# Patient Record
Sex: Male | Born: 1940 | Race: White | Hispanic: No | Smoking: Current every day smoker
Health system: Southern US, Community
[De-identification: ages and names within clinical notes are randomized; demographics above are authoritative.]

## PROBLEM LIST (undated history)

## (undated) DIAGNOSIS — E785 Hyperlipidemia, unspecified: Secondary | ICD-10-CM

## (undated) DIAGNOSIS — R05 Cough: Secondary | ICD-10-CM

## (undated) DIAGNOSIS — I219 Acute myocardial infarction, unspecified: Secondary | ICD-10-CM

## (undated) DIAGNOSIS — Z8614 Personal history of Methicillin resistant Staphylococcus aureus infection: Secondary | ICD-10-CM

## (undated) DIAGNOSIS — C801 Malignant (primary) neoplasm, unspecified: Secondary | ICD-10-CM

## (undated) DIAGNOSIS — F319 Bipolar disorder, unspecified: Secondary | ICD-10-CM

## (undated) DIAGNOSIS — I509 Heart failure, unspecified: Secondary | ICD-10-CM

## (undated) DIAGNOSIS — R351 Nocturia: Secondary | ICD-10-CM

## (undated) DIAGNOSIS — J449 Chronic obstructive pulmonary disease, unspecified: Secondary | ICD-10-CM

## (undated) DIAGNOSIS — I10 Essential (primary) hypertension: Secondary | ICD-10-CM

## (undated) DIAGNOSIS — R609 Edema, unspecified: Secondary | ICD-10-CM

## (undated) DIAGNOSIS — R42 Dizziness and giddiness: Secondary | ICD-10-CM

## (undated) DIAGNOSIS — N400 Enlarged prostate without lower urinary tract symptoms: Secondary | ICD-10-CM

## (undated) DIAGNOSIS — T8182XA Emphysema (subcutaneous) resulting from a procedure, initial encounter: Secondary | ICD-10-CM

## (undated) DIAGNOSIS — E119 Type 2 diabetes mellitus without complications: Secondary | ICD-10-CM

## (undated) DIAGNOSIS — Z8619 Personal history of other infectious and parasitic diseases: Secondary | ICD-10-CM

## (undated) DIAGNOSIS — Z86010 Personal history of colonic polyps: Secondary | ICD-10-CM

## (undated) DIAGNOSIS — J42 Unspecified chronic bronchitis: Secondary | ICD-10-CM

## (undated) DIAGNOSIS — M549 Dorsalgia, unspecified: Secondary | ICD-10-CM

## (undated) DIAGNOSIS — M1990 Unspecified osteoarthritis, unspecified site: Secondary | ICD-10-CM

## (undated) DIAGNOSIS — I2510 Atherosclerotic heart disease of native coronary artery without angina pectoris: Secondary | ICD-10-CM

## (undated) DIAGNOSIS — J189 Pneumonia, unspecified organism: Secondary | ICD-10-CM

## (undated) DIAGNOSIS — R059 Cough, unspecified: Secondary | ICD-10-CM

## (undated) DIAGNOSIS — K219 Gastro-esophageal reflux disease without esophagitis: Secondary | ICD-10-CM

## (undated) DIAGNOSIS — IMO0001 Reserved for inherently not codable concepts without codable children: Secondary | ICD-10-CM

## (undated) HISTORY — PX: KNEE SURGERY: SHX244

## (undated) HISTORY — PX: COLONOSCOPY: SHX174

## (undated) HISTORY — PX: MANDIBLE FRACTURE SURGERY: SHX706

## (undated) HISTORY — PX: TONSILLECTOMY: SUR1361

## (undated) HISTORY — PX: INGUINAL HERNIA REPAIR: SUR1180

---

## 2011-05-24 DIAGNOSIS — Z8614 Personal history of Methicillin resistant Staphylococcus aureus infection: Secondary | ICD-10-CM

## 2011-05-24 HISTORY — DX: Personal history of Methicillin resistant Staphylococcus aureus infection: Z86.14

## 2012-05-23 DIAGNOSIS — I219 Acute myocardial infarction, unspecified: Secondary | ICD-10-CM

## 2012-05-23 HISTORY — PX: CORONARY ARTERY BYPASS GRAFT: SHX141

## 2012-05-23 HISTORY — DX: Acute myocardial infarction, unspecified: I21.9

## 2014-11-26 ENCOUNTER — Emergency Department (HOSPITAL_COMMUNITY): Payer: Medicare Other

## 2014-11-26 ENCOUNTER — Inpatient Hospital Stay (HOSPITAL_COMMUNITY)
Admission: EM | Admit: 2014-11-26 | Discharge: 2014-11-28 | DRG: 190 | Disposition: A | Discharge status: Home / Self Care | Payer: Medicare Other | Attending: Family Medicine | Admitting: Family Medicine

## 2014-11-26 ENCOUNTER — Encounter (HOSPITAL_COMMUNITY): Payer: Self-pay | Admitting: Emergency Medicine

## 2014-11-26 DIAGNOSIS — Z8701 Personal history of pneumonia (recurrent): Secondary | ICD-10-CM

## 2014-11-26 DIAGNOSIS — D751 Secondary polycythemia: Secondary | ICD-10-CM | POA: Diagnosis present

## 2014-11-26 DIAGNOSIS — Z7982 Long term (current) use of aspirin: Secondary | ICD-10-CM

## 2014-11-26 DIAGNOSIS — R0600 Dyspnea, unspecified: Secondary | ICD-10-CM | POA: Diagnosis present

## 2014-11-26 DIAGNOSIS — R944 Abnormal results of kidney function studies: Secondary | ICD-10-CM | POA: Diagnosis present

## 2014-11-26 DIAGNOSIS — I2510 Atherosclerotic heart disease of native coronary artery without angina pectoris: Secondary | ICD-10-CM | POA: Diagnosis present

## 2014-11-26 DIAGNOSIS — E871 Hypo-osmolality and hyponatremia: Secondary | ICD-10-CM | POA: Diagnosis not present

## 2014-11-26 DIAGNOSIS — J441 Chronic obstructive pulmonary disease with (acute) exacerbation: Principal | ICD-10-CM | POA: Diagnosis present

## 2014-11-26 DIAGNOSIS — Z951 Presence of aortocoronary bypass graft: Secondary | ICD-10-CM

## 2014-11-26 DIAGNOSIS — I4510 Unspecified right bundle-branch block: Secondary | ICD-10-CM | POA: Diagnosis present

## 2014-11-26 DIAGNOSIS — I509 Heart failure, unspecified: Secondary | ICD-10-CM | POA: Diagnosis present

## 2014-11-26 DIAGNOSIS — J810 Acute pulmonary edema: Secondary | ICD-10-CM | POA: Insufficient documentation

## 2014-11-26 DIAGNOSIS — E1165 Type 2 diabetes mellitus with hyperglycemia: Secondary | ICD-10-CM | POA: Diagnosis present

## 2014-11-26 DIAGNOSIS — I252 Old myocardial infarction: Secondary | ICD-10-CM | POA: Diagnosis not present

## 2014-11-26 DIAGNOSIS — Z9861 Coronary angioplasty status: Secondary | ICD-10-CM

## 2014-11-26 DIAGNOSIS — Z79899 Other long term (current) drug therapy: Secondary | ICD-10-CM

## 2014-11-26 DIAGNOSIS — J9601 Acute respiratory failure with hypoxia: Secondary | ICD-10-CM | POA: Diagnosis present

## 2014-11-26 DIAGNOSIS — F17210 Nicotine dependence, cigarettes, uncomplicated: Secondary | ICD-10-CM | POA: Diagnosis present

## 2014-11-26 DIAGNOSIS — F319 Bipolar disorder, unspecified: Secondary | ICD-10-CM | POA: Diagnosis present

## 2014-11-26 DIAGNOSIS — Z794 Long term (current) use of insulin: Secondary | ICD-10-CM | POA: Diagnosis not present

## 2014-11-26 DIAGNOSIS — I10 Essential (primary) hypertension: Secondary | ICD-10-CM | POA: Diagnosis present

## 2014-11-26 DIAGNOSIS — Z6831 Body mass index (BMI) 31.0-31.9, adult: Secondary | ICD-10-CM

## 2014-11-26 DIAGNOSIS — E669 Obesity, unspecified: Secondary | ICD-10-CM | POA: Diagnosis present

## 2014-11-26 HISTORY — DX: Acute myocardial infarction, unspecified: I21.9

## 2014-11-26 HISTORY — DX: Chronic obstructive pulmonary disease, unspecified: J44.9

## 2014-11-26 HISTORY — DX: Heart failure, unspecified: I50.9

## 2014-11-26 HISTORY — DX: Atherosclerotic heart disease of native coronary artery without angina pectoris: I25.10

## 2014-11-26 HISTORY — DX: Type 2 diabetes mellitus without complications: E11.9

## 2014-11-26 HISTORY — DX: Pneumonia, unspecified organism: J18.9

## 2014-11-26 HISTORY — DX: Bipolar disorder, unspecified: F31.9

## 2014-11-26 HISTORY — DX: Unspecified chronic bronchitis: J42

## 2014-11-26 HISTORY — DX: Essential (primary) hypertension: I10

## 2014-11-26 LAB — CBC
HCT: 56.9 % — ABNORMAL HIGH (ref 39.0–52.0)
Hemoglobin: 19.2 g/dL — ABNORMAL HIGH (ref 13.0–17.0)
MCH: 32.4 pg (ref 26.0–34.0)
MCHC: 33.7 g/dL (ref 30.0–36.0)
MCV: 96 fL (ref 78.0–100.0)
PLATELETS: 155 10*3/uL (ref 150–400)
RBC: 5.93 MIL/uL — AB (ref 4.22–5.81)
RDW: 14.8 % (ref 11.5–15.5)
WBC: 10.4 10*3/uL (ref 4.0–10.5)

## 2014-11-26 LAB — BASIC METABOLIC PANEL
ANION GAP: 7 (ref 5–15)
BUN: 18 mg/dL (ref 6–20)
CHLORIDE: 110 mmol/L (ref 101–111)
CO2: 25 mmol/L (ref 22–32)
Calcium: 8.4 mg/dL — ABNORMAL LOW (ref 8.9–10.3)
Creatinine, Ser: 1.46 mg/dL — ABNORMAL HIGH (ref 0.61–1.24)
GFR calc Af Amer: 53 mL/min — ABNORMAL LOW (ref >60)
GFR calc non Af Amer: 46 mL/min — ABNORMAL LOW (ref >60)
Glucose, Bld: 79 mg/dL (ref 65–99)
Potassium: 4.3 mmol/L (ref 3.5–5.1)
Sodium: 142 mmol/L (ref 135–145)

## 2014-11-26 LAB — I-STAT TROPONIN, ED: Troponin i, poc: 0.06 ng/mL (ref 0.00–0.08)

## 2014-11-26 LAB — GLUCOSE, CAPILLARY: GLUCOSE-CAPILLARY: 473 mg/dL — AB (ref 65–99)

## 2014-11-26 LAB — BRAIN NATRIURETIC PEPTIDE: B NATRIURETIC PEPTIDE 5: 723.8 pg/mL — AB (ref 0.0–100.0)

## 2014-11-26 LAB — TROPONIN I
TROPONIN I: 0.04 ng/mL — AB (ref <0.031)
Troponin I: 0.05 ng/mL — ABNORMAL HIGH (ref <0.031)

## 2014-11-26 MED ORDER — INSULIN ASPART 100 UNIT/ML ~~LOC~~ SOLN: 0.0000 [IU] | Freq: Three times a day (TID) | SUBCUTANEOUS | Status: DC

## 2014-11-26 MED ORDER — ALBUTEROL SULFATE (2.5 MG/3ML) 0.083% IN NEBU
5.0000 mg | INHALATION_SOLUTION | Freq: Once | RESPIRATORY_TRACT | Status: AC
  Administered 2014-11-26: 5 mg via RESPIRATORY_TRACT
  Filled 2014-11-26: qty 6

## 2014-11-26 MED ORDER — ASPIRIN 81 MG PO CHEW
81.0000 mg | CHEWABLE_TABLET | Freq: Every day | ORAL | Status: DC
  Administered 2014-11-26 – 2014-11-28 (×3): 81 mg via ORAL
  Filled 2014-11-26 (×4): qty 1

## 2014-11-26 MED ORDER — IPRATROPIUM-ALBUTEROL 0.5-2.5 (3) MG/3ML IN SOLN
3.0000 mL | RESPIRATORY_TRACT | Status: DC
  Administered 2014-11-26 – 2014-11-28 (×10): 3 mL via RESPIRATORY_TRACT
  Filled 2014-11-26 (×10): qty 3

## 2014-11-26 MED ORDER — SODIUM CHLORIDE 0.9 % IJ SOLN
3.0000 mL | Freq: Two times a day (BID) | INTRAMUSCULAR | Status: DC
  Administered 2014-11-26 – 2014-11-28 (×4): 3 mL via INTRAVENOUS

## 2014-11-26 MED ORDER — AZITHROMYCIN 250 MG PO TABS
250.0000 mg | ORAL_TABLET | Freq: Every day | ORAL | Status: DC
  Administered 2014-11-27 – 2014-11-28 (×2): 250 mg via ORAL
  Filled 2014-11-26 (×2): qty 1

## 2014-11-26 MED ORDER — IPRATROPIUM-ALBUTEROL 0.5-2.5 (3) MG/3ML IN SOLN: 3.0000 mL | RESPIRATORY_TRACT | Status: DC | PRN

## 2014-11-26 MED ORDER — INSULIN GLARGINE 100 UNIT/ML ~~LOC~~ SOLN
10.0000 [IU] | Freq: Every day | SUBCUTANEOUS | Status: DC
  Administered 2014-11-26 – 2014-11-27 (×2): 10 [IU] via SUBCUTANEOUS
  Filled 2014-11-26 (×2): qty 0.1

## 2014-11-26 MED ORDER — PREDNISONE 50 MG PO TABS
50.0000 mg | ORAL_TABLET | Freq: Every day | ORAL | Status: DC
  Administered 2014-11-27: 50 mg via ORAL
  Filled 2014-11-26 (×3): qty 1

## 2014-11-26 MED ORDER — ALBUTEROL (5 MG/ML) CONTINUOUS INHALATION SOLN
5.0000 mg/h | INHALATION_SOLUTION | Freq: Once | RESPIRATORY_TRACT | Status: DC
  Filled 2014-11-26: qty 20

## 2014-11-26 MED ORDER — HEPARIN SODIUM (PORCINE) 5000 UNIT/ML IJ SOLN
5000.0000 [IU] | Freq: Three times a day (TID) | INTRAMUSCULAR | Status: DC
  Administered 2014-11-26 – 2014-11-28 (×6): 5000 [IU] via SUBCUTANEOUS
  Filled 2014-11-26 (×7): qty 1

## 2014-11-26 MED ORDER — INSULIN ASPART 100 UNIT/ML ~~LOC~~ SOLN
0.0000 [IU] | Freq: Three times a day (TID) | SUBCUTANEOUS | Status: DC
  Administered 2014-11-26: 15 [IU] via SUBCUTANEOUS
  Administered 2014-11-27: 5 [IU] via SUBCUTANEOUS
  Administered 2014-11-27: 3 [IU] via SUBCUTANEOUS
  Administered 2014-11-27: 8 [IU] via SUBCUTANEOUS
  Administered 2014-11-28: 15 [IU] via SUBCUTANEOUS
  Administered 2014-11-28: 11 [IU] via SUBCUTANEOUS
  Administered 2014-11-28: 3 [IU] via SUBCUTANEOUS

## 2014-11-26 MED ORDER — FUROSEMIDE 10 MG/ML IJ SOLN
40.0000 mg | Freq: Once | INTRAMUSCULAR | Status: AC
  Administered 2014-11-26: 40 mg via INTRAVENOUS
  Filled 2014-11-26: qty 4

## 2014-11-26 MED ORDER — AZITHROMYCIN 500 MG PO TABS
500.0000 mg | ORAL_TABLET | Freq: Once | ORAL | Status: AC
  Administered 2014-11-26: 500 mg via ORAL
  Filled 2014-11-26: qty 1

## 2014-11-26 MED ORDER — IPRATROPIUM BROMIDE 0.02 % IN SOLN
0.5000 mg | Freq: Once | RESPIRATORY_TRACT | Status: AC
  Administered 2014-11-26: 0.5 mg via RESPIRATORY_TRACT
  Filled 2014-11-26: qty 2.5

## 2014-11-26 MED ORDER — MAGNESIUM SULFATE 2 GM/50ML IV SOLN
2.0000 g | Freq: Once | INTRAVENOUS | Status: AC
  Administered 2014-11-26: 2 g via INTRAVENOUS
  Filled 2014-11-26: qty 50

## 2014-11-26 NOTE — H&P (Signed)
[facility] Admission History and Physical Service Pager: [phone]  Patient name: Austin Stephenson Medical record number: [id number] Date of birth: [birth year] Age: 74 y.o. Gender: male  Primary Care Provider: Pcp Not In System Consultants: none Code Status: full (obtained on admission)  Chief Complaint: SOB  Assessment and Plan: Tyreck Bell is a 74 y.o. male presenting with dyspnea. PMH is significant for COPD, CAD s/p CABG at [place] in 2014, DM-2, hypertension, BPD and smoking. Also with elevated creatinine, polycythemia, and elevated troponin.   Acute respiratory failure / COPD: Dyspnea likely a combination of COPD exacerbation and CHF.Patient on albuterol inhaler and neb at home. Unclear if he takes other medication for this. He gets all his meds from [place]. Rhonchi bilaterally on exam with some wheezing in background. Less likely to be infection given stable vital signs and normal CBC. Received Solumedrol 125 mg IV on his way to ED and MgSO4 2gm in ED.  -Admit to telemetry under attending Dr Mingo Amber -Prednisone 50 mg PO daily for 5 days -Duonebs q4h / q2hr prn -Azithromycin 500 mg PO today, then 250 mg daily -Monitor O2 sats -supplemental oxygen as needed to keep sats > 88% -Will call his pharmacy ([facility] in [place] to get his med list) -Will need to be on controller medication if not already   ?CHF: unclear if this is new or old. He has mild JVD with hepatojegular reflex on exam. His CXR significant for interstitial edema. BMP 723 (baseline unknown), Trop 0.05 likely from strain vs. ACS given no chest pain. EKG sinus rythem with RBBB and T-wave inversion in lateral leads.  -Trend troponin   -s/p Lasix 40mg  IV in ED -TTE -Strict I/Os -Daily Weights -Will attempt to get records of cardiac history from [place]  CAD: s/p CABG (triple bypass) in 2014. Patient unsure of medications.  -ASA 81 mg daily -Consider BB and ACI bases on his BP. -Obtain medication hx  from his pharmacy   HTN: BP WNL. Patient couldn't tell what he takes at home. -monitor BP -Obtain medication hx from his pharmacy  DM-2: Blood glucose 79 on arrival. He reports running 70's to 80's at home. On long acting insulin 20 units plus 5 units short acting at home. - Decrease lantus to 10U daily - SSI - Consider obtaining A1c  Elevated creatinine: sCr of 1.46. Unknown baseline.  -Repeat BMP  Bipolar Disorder: on medication per pt but couldn't tell what he takes at home. -Obtain medication hx from his pharmacy  FEN/GI: F: KVO E: replete as needed N: Heart-healthy carb-modified  Prophylaxis:  -subq heparin  Disposition: [facility] pending improvement in respiratory status.   History of Present Illness: Austin Stephenson is a 74 y.o. male presents to the ED  Via EMS with shortness of breath for the last 24 hours. PMH significant for CAD s/p CABG in 2014 at [place], COPD, DM-2 on insulin and BPD. History is limited as patient is not a good historian.  Patient has had difficulty breathing for the past several days, and has significantly worsened over the past 24 hours. He tried albuterol inhaler as well as nebulizer treatments but they didn't help. Dyspnea is worse when laying flat. He normally uses two pillows at night, which hasn't changed. He reports waking up at night feeling choked. He also endorses cough, spitting up clear phlegm but no hemoptysis.  He denies chest pain, fever, chills, vision changes, abdominal pain, dysuria & edema. He endorses mild headache and nausea but denies vomiting.  Off note, patient reports multiple admission, almost every two months, to [facility] hospital in [place] for shortness of breath. He is unsure if he had Echo recently. Reports having a stress test 3-4 that was "a little less than normal."   He is on Insulin 20U long acting and 5 units short acting. He is unsure of his medication for his other medical conditions.  Is able to walk around slowly  at baseline. Walks 50-100 ft before getting short of breath. Currently weigh 204 pounds.   Patient received Solumedrol 125 mg IV enroute to ED.  Review Of Systems: Per HPI, otherwise a 12-point review of systems was performed and was negative.   Patient Active Problem List   Diagnosis Date Noted  . Dyspnea 11/26/2014   Past Medical History: Past Medical History  Diagnosis Date  . COPD (chronic obstructive pulmonary disease)   . Hypertension   . Coronary artery disease   . Type II diabetes mellitus   . Myocardial infarction 2014  . CHF (congestive heart failure)   . Pneumonia "several times"  . Chronic bronchitis     "get it just about q yr" (11/26/2014)  . Bipolar disorder    Past Surgical History: Past Surgical History  Procedure Laterality Date  . Coronary angioplasty    . Tonsillectomy    . Inguinal hernia repair    . Coronary artery bypass graft  2014    at [facility]; "CABG X3"  . Colonoscopy     Social History: History  Substance Use Topics  . Smoking status: Current Every Day Smoker -- 1.00 packs/day for 60 years    Types: Cigarettes  . Smokeless tobacco: Never Used  . Alcohol Use: No   Additional social history: lives in [place] home as a group. He lives in [place] home, smokes about a pack a day the whole of his life, drinks occasionally. 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  Please also refer to relevant sections of EMR.  Family History: Noncontributory.   Allergies and Medications: No Known Allergies No current facility-administered medications on file prior to encounter.   No current outpatient prescriptions on file prior to encounter.    Objective: BP 157/66 mmHg  Pulse 98  Temp(Src) 97.4 F (36.3 C) (Oral)  Resp 20  Ht 5\' 7"  (1.702 m)  Wt 199 lb 1.6 oz (90.311 kg)  BMI 31.18 kg/m2  SpO2 94% Exam: General: stable, using his nebulizer, converses in full sentence but went in to cough fit when asked to breath for lung  exam. Eyes: EOMI, PERRL Neck: mild JVD, with +HJR to mid-neck Cardiovascular: heart sounds obscured by upper airway sound. 2+ radial pulse,  Respiratory: Mildly increased work of breathing with mild tachypnea and supraclavicular retractions. rhonchi bilaterally, with some wheezes scattered diffusely. No crackles appreciated, though difficult exam due to upper airway sounds Abdomen: +BS, obese,  NTND Ext: no edema in LE, warm to touch, 2+ radial and dorsalis pedis pulse. Skin: no lesion or rashes appreciated  Neuro: Alert and conversational, grossly intact. Psych: normal affect and thought content  Labs and Imaging: CBC BMET   Recent Labs Lab 11/26/14 1308  WBC 10.4  HGB 19.2*  HCT 56.9*  PLT 155    Recent Labs Lab 11/26/14 1308  NA 142  K 4.3  CL 110  CO2 25  BUN 18  CREATININE 1.46*  GLUCOSE 79  CALCIUM 8.4*     BNP 723.8 Troponin 0.05  EKG: NSR,  TWI in V1-V4, RBBB  Dg Chest 2 View (if Patient Has Fever And/or Copd)  11/26/2014   CLINICAL DATA:  Cough for the last several days. Progressively worsening shortness of breath since last night. Wheezing. History of COPD.  EXAM: CHEST  2 VIEW  COMPARISON:  None.  FINDINGS: Sequelae of prior CABG are identified. Cardiac silhouette is mildly enlarged. There are relatively symmetric interstitial densities in both lungs, greatest in the mid and lower lungs. No segmental airspace consolidation or pneumothorax is identified. No sizable pleural effusion is identified, although the posterior costophrenic angles or incompletely imaged in a trace right pleural effusion is questioned. There is thickening along the fissures. No acute osseous abnormality is identified.  IMPRESSION: Interstitial densities throughout both lungs, suggestive of edema. A component of chronic interstitial lung disease is also possible, however no priors are available for comparison.   Electronically Signed   By: Logan Bores   On: 11/26/2014 13:10   Mercy Riding,  MD 11/26/2014, 3:20 PM PGY-1, [place] Intern pager: [phone], text pages welcome  I have read the above note and made revisions highlighted in blue. Algis Greenhouse. Jerline Pain, [place] Resident PGY-2 11/26/2014 8:00 PM

## 2014-11-26 NOTE — Progress Notes (Signed)
Report received from ED at 1643 and pt arrived to the unit at 1700; pt A&O x4; oriented to the unit and room; VSS, telemetry applied and verified; pt denies any pain; skin intact; pt in bed watching TV with call light within reach. Will closely monitor. Francis Gaines Janeah Kovacich RN.

## 2014-11-26 NOTE — Progress Notes (Deleted)
MD notified of lab calling RN concerning pt glucose of 597 as well as pt rechecked CBG results. MD said to start insulin drip once approved by pharmacy. Pt asymptomatic; will closely monitor. Francis Gaines Patt Steinhardt RN.

## 2014-11-26 NOTE — ED Notes (Addendum)
Pt arrives via EMS from home with cough for last several days, progressively worsening SOB since last night. Here with wheezing in all lung fields. PTA 15mg  albuterol/0.5mg atrovent, 125mg  solumedrol. 18g LAC. Pt continues to smoke. Used home rescue inhalers with little relief.

## 2014-11-26 NOTE — ED Provider Notes (Signed)
CSN: [id number]     Arrival date & time 11/26/14  1219 History   First MD Initiated Contact with Patient 11/26/14 1244     Chief Complaint  Patient presents with  . COPD     (Consider location/radiation/quality/duration/timing/severity/associated sxs/prior Treatment) HPI Comments: Patient is a 74 year old male with history of COPD, diabetes, and coronary artery disease. He presents for evaluation of shortness of breath for the past several days, and worsened last night. He denies any fevers or chills. He denies any productive cough. Patient does smoke. He used his albuterol nebulizer at home with little relief. He received IV Solu-Medrol en route as well as breathing treatments.  Patient is a 74 y.o. male presenting with shortness of breath. The history is provided by the patient.  Shortness of Breath Severity:  Moderate Onset quality:  Gradual Duration:  3 days Timing:  Constant Progression:  Worsening Chronicity:  Recurrent Context: activity   Relieved by:  Nothing Worsened by:  Nothing tried Ineffective treatments: Home nebulizer treatment. Associated symptoms: no chest pain and no fever     Past Medical History  Diagnosis Date  . COPD (chronic obstructive pulmonary disease)   . Diabetes mellitus without complication   . Hypertension   . Coronary artery disease    Past Surgical History  Procedure Laterality Date  . Cardiac surgery     No family history on file. History  Substance Use Topics  . Smoking status: Current Every Day Smoker -- 1.00 packs/day  . Smokeless tobacco: Not on file  . Alcohol Use: No    Review of Systems  Constitutional: Negative for fever.  Respiratory: Positive for shortness of breath.   Cardiovascular: Negative for chest pain.  All other systems reviewed and are negative.     Allergies  Review of patient's allergies indicates no known allergies.  Home Medications   Prior to Admission medications   Not on File   BP 155/60 mmHg   Pulse 88  Temp(Src) 97.6 F (36.4 C) (Oral)  Resp 31  SpO2 95% Physical Exam  Constitutional: He is oriented to person, place, and time. He appears well-developed and well-nourished. No distress.  HENT:  Head: Normocephalic and atraumatic.  Mouth/Throat: Oropharynx is clear and moist.  Neck: Normal range of motion. Neck supple.  Cardiovascular: Normal rate, regular rhythm and normal heart sounds.   No murmur heard. Pulmonary/Chest: He is in respiratory distress. He has wheezes. He has no rales.  Patient has bilateral expiratory rhonchi. He is in mild respiratory distress, but can finish sentences.  Abdominal: Soft. Bowel sounds are normal. He exhibits no distension. There is no tenderness.  Musculoskeletal: Normal range of motion. He exhibits no edema.  Neurological: He is alert and oriented to person, place, and time.  Skin: Skin is warm and dry. He is not diaphoretic.  Nursing note and vitals reviewed.   ED Course  Procedures (including critical care time) Labs Review Labs Reviewed  BASIC METABOLIC PANEL  CBC  TROPONIN I  BRAIN NATRIURETIC PEPTIDE  I-STAT [place], ED    Imaging Review No results found.   EKG Interpretation   Date/Time:  Wednesday November 26 2014 12:27:46 EDT Ventricular Rate:  87 PR Interval:  184 QRS Duration: 123 QT Interval:  428 QTC Calculation: 515 R Axis:   13 Text Interpretation:  Sinus rhythm Probable left atrial enlargement Right  bundle branch block Nonspecific T abnormalities, lateral leads Confirmed  by Kandi Brusseau  MD, Dontarious Schaum ([id number]) on 11/26/2014 1:35:34 PM  MDM   Final diagnoses:  None    Patient is a 74 year old male with history of coronary artery disease status post coronary artery bypass graft. He also has a history of COPD. He presents with difficulty breathing that I feel is multifactorial. He has wheezing and this improved somewhat with breathing treatments, steroids, and magnesium. He is also found to have an elevated BNP  and chest x-ray is suggestive of mild pulmonary edema. He was given Lasix for this. He is feeling somewhat better, however I feel as though requires admission. He has some hypoxia along with a mildly positive troponin. I've spoken with family practice who will evaluate the patient in the ER.  CRITICAL CARE Performed by: Veryl Speak Total critical care time: 30 minutes Critical care time was exclusive of separately billable procedures and treating other patients. Critical care was necessary to treat or prevent imminent or life-threatening deterioration. Critical care was time spent personally by me on the following activities: development of treatment plan with patient and/or surrogate as well as nursing, discussions with consultants, evaluation of patient's response to treatment, examination of patient, obtaining history from patient or surrogate, ordering and performing treatments and interventions, ordering and review of laboratory studies, ordering and review of radiographic studies, pulse oximetry and re-evaluation of patient's condition.     Veryl Speak, MD 11/26/14 [phone]

## 2014-11-27 ENCOUNTER — Inpatient Hospital Stay (HOSPITAL_COMMUNITY): Payer: Medicare Other

## 2014-11-27 DIAGNOSIS — I10 Essential (primary) hypertension: Secondary | ICD-10-CM

## 2014-11-27 DIAGNOSIS — J810 Acute pulmonary edema: Secondary | ICD-10-CM | POA: Insufficient documentation

## 2014-11-27 DIAGNOSIS — R55 Syncope and collapse: Secondary | ICD-10-CM

## 2014-11-27 DIAGNOSIS — R0600 Dyspnea, unspecified: Secondary | ICD-10-CM

## 2014-11-27 DIAGNOSIS — J441 Chronic obstructive pulmonary disease with (acute) exacerbation: Secondary | ICD-10-CM | POA: Insufficient documentation

## 2014-11-27 DIAGNOSIS — Z951 Presence of aortocoronary bypass graft: Secondary | ICD-10-CM | POA: Insufficient documentation

## 2014-11-27 LAB — GLUCOSE, CAPILLARY
GLUCOSE-CAPILLARY: 426 mg/dL — AB (ref 65–99)
Glucose-Capillary: 165 mg/dL — ABNORMAL HIGH (ref 65–99)
Glucose-Capillary: 211 mg/dL — ABNORMAL HIGH (ref 65–99)
Glucose-Capillary: 265 mg/dL — ABNORMAL HIGH (ref 65–99)
Glucose-Capillary: 339 mg/dL — ABNORMAL HIGH (ref 65–99)
Glucose-Capillary: 458 mg/dL — ABNORMAL HIGH (ref 65–99)

## 2014-11-27 LAB — TROPONIN I
Troponin I: 0.06 ng/mL — ABNORMAL HIGH (ref <0.031)
Troponin I: 0.07 ng/mL — ABNORMAL HIGH (ref <0.031)

## 2014-11-27 LAB — COMPREHENSIVE METABOLIC PANEL
ALBUMIN: 3.5 g/dL (ref 3.5–5.0)
ALK PHOS: 68 U/L (ref 38–126)
ALT: 36 U/L (ref 17–63)
ANION GAP: 13 (ref 5–15)
AST: 28 U/L (ref 15–41)
BUN: 23 mg/dL — ABNORMAL HIGH (ref 6–20)
CO2: 24 mmol/L (ref 22–32)
Calcium: 8.1 mg/dL — ABNORMAL LOW (ref 8.9–10.3)
Chloride: 97 mmol/L — ABNORMAL LOW (ref 101–111)
Creatinine, Ser: 1.79 mg/dL — ABNORMAL HIGH (ref 0.61–1.24)
GFR calc Af Amer: 41 mL/min — ABNORMAL LOW (ref >60)
GFR calc non Af Amer: 36 mL/min — ABNORMAL LOW (ref >60)
Glucose, Bld: 433 mg/dL — ABNORMAL HIGH (ref 65–99)
Potassium: 4.1 mmol/L (ref 3.5–5.1)
Sodium: 134 mmol/L — ABNORMAL LOW (ref 135–145)
TOTAL PROTEIN: 6.2 g/dL — AB (ref 6.5–8.1)
Total Bilirubin: 0.9 mg/dL (ref 0.3–1.2)

## 2014-11-27 LAB — CBC
HCT: 55.7 % — ABNORMAL HIGH (ref 39.0–52.0)
Hemoglobin: 18.5 g/dL — ABNORMAL HIGH (ref 13.0–17.0)
MCH: 31.8 pg (ref 26.0–34.0)
MCHC: 33.2 g/dL (ref 30.0–36.0)
MCV: 95.7 fL (ref 78.0–100.0)
Platelets: 171 10*3/uL (ref 150–400)
RBC: 5.82 MIL/uL — ABNORMAL HIGH (ref 4.22–5.81)
RDW: 14.7 % (ref 11.5–15.5)
WBC: 11.8 10*3/uL — ABNORMAL HIGH (ref 4.0–10.5)

## 2014-11-27 MED ORDER — INSULIN GLARGINE 100 UNIT/ML ~~LOC~~ SOLN
5.0000 [IU] | Freq: Once | SUBCUTANEOUS | Status: AC
  Administered 2014-11-27: 5 [IU] via SUBCUTANEOUS
  Filled 2014-11-27: qty 0.05

## 2014-11-27 MED ORDER — BENZONATATE 100 MG PO CAPS
100.0000 mg | ORAL_CAPSULE | Freq: Two times a day (BID) | ORAL | Status: DC
  Administered 2014-11-27 – 2014-11-28 (×3): 100 mg via ORAL
  Filled 2014-11-27 (×4): qty 1

## 2014-11-27 MED ORDER — INSULIN GLARGINE 100 UNIT/ML ~~LOC~~ SOLN
15.0000 [IU] | Freq: Every day | SUBCUTANEOUS | Status: DC
  Filled 2014-11-27: qty 0.15

## 2014-11-27 MED ORDER — INSULIN ASPART 100 UNIT/ML ~~LOC~~ SOLN
10.0000 [IU] | Freq: Once | SUBCUTANEOUS | Status: AC
  Administered 2014-11-27: 10 [IU] via SUBCUTANEOUS

## 2014-11-27 NOTE — Progress Notes (Signed)
Utilization review completed. Melany Wiesman, RN, BSN. 

## 2014-11-27 NOTE — Discharge Summary (Signed)
Physician Discharge Summary  Patient ID: Austin Stephenson MRN: [id number] DOB/AGE: May 21, 1941 74 y.o.  Admit date: 11/26/2014 Discharge date: 11/27/2014  Admission Diagnoses: Dyspnea and cough  Discharge Diagnoses:  Active Problems:   Dyspnea   Acute pulmonary edema   COPD exacerbation   History of coronary artery bypass graft   Essential hypertension   Discharged Condition: fair  Hospital Course:   [name] is a 74 y.o. male presenting with dyspnea. PMH is significant for COPD, CAD s/p CABG at [place] in 2014, DM-2, hypertension, BPD and smoking. Also with elevated creatinine, polycythemia, and elevated troponin.   Dyspnea/cough likely 2/2:likely a combination of COPD exacerbation and CHF. Cough productive with small clear phlegm. No hemoptysis. Rhonchi bilaterally on exam with some wheezing in background. Less likely to be infection given stable vital signs and normal CBC. Received Solumedrol 125 mg IV on his way to ED and MgSO4 2gm in ED. Started on steroid brust, azithromycin & duonebs with subsequent improvement in his dyspnea. On the day of discharge patient has overall improved, cough decreased, though still present. Still end expiratory wheezing over lower bases, but better exam than prior.Not hypoxic. Patient states he's ready for home. He was counseled on smoking cessation. He was discharged on azithromycin and prednisone burst to complete the course for five days.  CHF: unclear if this is new or old. He has mild JVD with hepatojegular reflex on exam. But no edema. His CXR significant for interstitial edema. BMP 723 (baseline unknown), Trop 0.05>0.04>0.06, EKG sinus rythem with RBBB and T-wave inversion in lateral leads likely from strain vs. ACS given no chest pain. Echo with EF of 45-50% plus septal and inferior wall hypokinesis of LV (no prior Echo to compare). Received a dose of lasix 40mg  IV in ED which was held upon arrival on unit.   CAD: s/p CABG (triple bypass) in  2014. Patient unsure of medications. Started ASA here.   DM-2: Blood glucose 79 on arrival, up in high 400's overnight but trended down to 165 after SSI and lantus. Received diabetic teaching.  Polycythemia: Hgb 19.2>18.6 adn HCT 57>56. Likely from his COPD.  Bipolar Disorder: stable. on medication per pt but couldn't tell what he takes at home.  Patient's hospitalization was complicated by elevated creatinine:from 1.46 (unknown baseline) to 1.79  and hyponatremia down to 134 on HD-2 likely from lasix.  Consults: cardiology  Significant Diagnostic Studies: Cardiac Echo  Discharge Exam: Blood pressure 170/79, pulse 99, temperature 98 F (36.7 C), temperature source Oral, resp. rate 16, height 5\' 7"  (1.702 m), weight 200 lb 11.2 oz (91.037 kg), SpO2 95 %.  Discharge Exam: See the progress note from the day of discharge.  Disposition: Final discharge disposition not confirmed     Medication List    ASK your doctor about these medications        guaiFENesin 600 MG 12 hr tablet  Commonly known as:  MUCINEX  Take 600 mg by mouth 2 (two) times daily as needed for cough.       Things to follow up on: -COPD -Elevated creatinine -Blood glucose  Signed: Mercy Riding 11/27/2014, 8:03 PM

## 2014-11-27 NOTE — Progress Notes (Signed)
  Echocardiogram 2D Echocardiogram has been performed.  Jennette Dubin 11/27/2014, 11:14 AM

## 2014-11-27 NOTE — Progress Notes (Signed)
Family Medicine Teaching Service Daily Progress Note Intern Pager: [phone]  Patient name: Austin Stephenson record number: [id number] Date of birth: 1942/07/16Age: 74 y.o.Gender: male  Primary Care Provider: Pcp Not In System Consultants: none Code Status: full (obtained on admission)  Chief Complaint: SOB  Assessment and Plan: Austin Stephenson is a 74 y.o. male presenting with dyspnea. PMH is significant for COPD, CAD s/p CABG at [place] in 2014, DM-2, hypertension, BPD and smoking. Also with elevated creatinine, polycythemia, and elevated troponin.   Acute respiratory failure / COPD: Dyspnea likely a combination of COPD exacerbation and CHF.Patient on albuterol inhaler and neb at home. Unclear if he takes other medication for this. He gets all his meds from [place]. Rhonchi bilaterally on exam with some wheezing in background. Less likely to be infection given stable vital signs and normal CBC. Received Solumedrol 125 mg IV on his way to ED and MgSO4 2gm in ED. Patient sating in high 80's to low 90's on room air ON but up to 98% on 2L by [place]. He wasn't wearing O2 this morning. SOB improved. Still coughing. Spits up little phlegm. Lung exam unchanged from admission. -Admit to telemetry under attending Dr Mingo Amber -Continue prednisone 50 mg PO daily for 5 days -Continue Duonebs q4h / q2hr prn -Continue Azithromycin 500 mg PO today, then 250 mg daily -supplemental oxygen as needed to keep sats > 88% -Will call his pharmacy ([facility] in [place] to get his med list) - Tessalon 100 mg PO bid for cough. -Will need to be on controller medication if not already   CHF: unclear if this is new or old. He has mild JVD with hepatojegular reflex on exam. His CXR significant for interstitial edema. BMP 723 (baseline unknown), Trop 0.05 likely from strain vs. ACS given no chest pain. EKG sinus rythem with RBBB and T-wave inversion in lateral leads. Wt about the same since admission. TTE  pending. -Trend troponin0.05>0.04>0.06 -s/p Lasix 40mg  IV in ED -f/u TTE  -Strict I/Os (-766mls ON) -Daily Weights -Will attempt to get records of cardiac history from [place]  CAD: s/p CABG (triple bypass) in 2014. Patient unsure of medications.  -ASA 81 mg daily -Consider BB and ACI bases on his BP. -Obtain medication hx from his pharmacy  HTN: BP WNL. Patient couldn't tell what he takes at home. -monitor BP -Obtain medication hx from his pharmacy  DM-2: Blood glucose 79 on arrival. He reports running 70's to 80's at home. On long acting insulin 20 units plus 5 units short acting at home. CBG up in high 400's overnight but trended down to 165 after SSI and lantus. - Lantus to 10U daily - SSI - f/u A1c  Elevated creatinine: sCr of 1.46 on admission. 1.79 this AM. Unknown baseline. Likely 2/2 lasix -Repeat BMP this PM  Hyponatremia: Na down to 134 this AM (142 on admission). Likely from lasix and elevated blood glucose.  -will check BMP in PM  Polycythemia: Hgb 19.2>18.6 adn HCT 57>56. Likely 2/2 to his COPD.  Bipolar Disorder: on medication per pt but couldn't tell what he takes at home. -Obtain medication hx from his pharmacy  FEN/GI: F: KVO E: replete as needed N: Heart-healthy carb-modified  Prophylaxis:  -subq heparin  Subjective:  SOB improved. Still coughing. Spits up little phlegm. Asks for something that can help him expectorate. Denies chest pain & headache.  Objective: Temp:  [97.4 F (36.3 C)-98 F (36.7 C)] 98 F (36.7 C) (07/07 0547) Pulse Rate:  [86-105] 96 (07/07 0547)  Resp:  [16-31] 18 (07/07 0547) BP: (128-161)/(41-70) 152/63 mmHg (07/07 0547) SpO2:  [87 %-98 %] 98 % (07/07 0547) Weight:  [199 lb 1.6 oz (90.311 kg)-200 lb 11.2 oz (91.037 kg)] 200 lb 11.2 oz (91.037 kg) (07/07 0547)  Physical Exam: General: stable, using his nebulizer, converses in full sentence but went in to cough fit when asked to breath for lung exam. Eyes: EOMI,  PERRL Neck: mild JVD, with +HJR to mid-neck Cardiovascular: heart sounds obscured by upper airway sound. 2+ radial pulse,  Respiratory: no WOB, no retraction but rhonchi bilaterally, with some wheezes scattered diffusely. No crackles appreciated Abdomen: +BS, obese, NTND Ext: no edema in LE, warm to touch, 2+ radial and dorsalis pedis pulse. Skin: no lesion or rashes appreciated  Neuro: Alert and conversational, grossly intact. Psych: normal affect and thought content  Laboratory:  Recent Labs Lab 11/26/14 1308 11/27/14 0247  WBC 10.4 11.8*  HGB 19.2* 18.5*  HCT 56.9* 55.7*  PLT 155 171    Recent Labs Lab 11/26/14 1308 11/27/14 0247  NA 142 134*  K 4.3 4.1  CL 110 97*  CO2 25 24  BUN 18 23*  CREATININE 1.46* 1.79*  CALCIUM 8.4* 8.1*  PROT  --  6.2*  BILITOT  --  0.9  ALKPHOS  --  68  ALT  --  36  AST  --  28  GLUCOSE 79 433*   Imaging/Diagnostic Tests: Dg Chest 2 View (if Patient Has Fever And/or Copd)  11/26/2014   CLINICAL DATA:  Cough for the last several days. Progressively worsening shortness of breath since last night. Wheezing. History of COPD.  EXAM: CHEST  2 VIEW  COMPARISON:  None.  FINDINGS: Sequelae of prior CABG are identified. Cardiac silhouette is mildly enlarged. There are relatively symmetric interstitial densities in both lungs, greatest in the mid and lower lungs. No segmental airspace consolidation or pneumothorax is identified. No sizable pleural effusion is identified, although the posterior costophrenic angles or incompletely imaged in a trace right pleural effusion is questioned. There is thickening along the fissures. No acute osseous abnormality is identified.  IMPRESSION: Interstitial densities throughout both lungs, suggestive of edema. A component of chronic interstitial lung disease is also possible, however no priors are available for comparison.   Electronically Signed   By: Logan Bores   On: 11/26/2014 13:10    Mercy Riding,  MD 11/27/2014, 7:05 AM PGY-1, [place] Intern pager: [phone], text pages welcome

## 2014-11-27 NOTE — Research (Signed)
REDS_0  Informed Consent   Subject Name: Austin Stephenson  Subject met inclusion and exclusion criteria.  The informed consent form, study requirements and expectations were reviewed with the subject and questions and concerns were addressed prior to the signing of the consent form.  The subject verbalized understanding of the trail requirements.  The subject agreed to participate in the REDS_1  trial and signed the informed consent.  The informed consent was obtained prior to performance of any protocol-specific procedures for the subject.  A copy of the signed informed consent was given to the subject and a copy was placed in the subject's medical record.  [name] 11/27/2014, 12:55

## 2014-11-28 LAB — GLUCOSE, CAPILLARY
GLUCOSE-CAPILLARY: 332 mg/dL — AB (ref 65–99)
GLUCOSE-CAPILLARY: 412 mg/dL — AB (ref 65–99)
GLUCOSE-CAPILLARY: 416 mg/dL — AB (ref 65–99)
Glucose-Capillary: 188 mg/dL — ABNORMAL HIGH (ref 65–99)

## 2014-11-28 MED ORDER — PREDNISONE 50 MG PO TABS
50.0000 mg | ORAL_TABLET | Freq: Every day | ORAL | Status: DC
  Administered 2014-11-28: 50 mg via ORAL
  Filled 2014-11-28 (×2): qty 1

## 2014-11-28 MED ORDER — ASPIRIN 81 MG PO CHEW: 81.0000 mg | CHEWABLE_TABLET | Freq: Every day | ORAL | Status: DC

## 2014-11-28 MED ORDER — AZITHROMYCIN 250 MG PO TABS: 250.0000 mg | ORAL_TABLET | Freq: Every day | ORAL | Status: DC

## 2014-11-28 MED ORDER — BENZONATATE 100 MG PO CAPS
200.0000 mg | ORAL_CAPSULE | Freq: Three times a day (TID) | ORAL | Status: DC | PRN
  Administered 2014-11-28: 200 mg via ORAL
  Filled 2014-11-28 (×2): qty 2

## 2014-11-28 MED ORDER — PREDNISONE 50 MG PO TABS: 50.0000 mg | ORAL_TABLET | Freq: Every day | ORAL | Status: DC

## 2014-11-28 NOTE — Clinical Documentation Improvement (Signed)
Renal labs as below.  Current documentation notes "Elevated creatinine: sCr of 1.46 on admission, 1.79 this am.  Unknown baseline.  Likely 2/2 lasix."  Please identify any clinical conditions associated with the bump in renal labs and document in your future progress notes and carry over to the discharge summary.     Component      BUN Creatinine  Latest Ref Rng      6 - 20 mg/dL 0.61 - 1.24 mg/dL  11/26/2014     1:08 PM 18 1.46 (H)  11/27/2014     2:47 AM 23 (H) 1.79 (H)   Component      EGFR (Non-African Amer.)  Latest Ref Rng      >60 mL/min  11/26/2014     1:08 PM 46 (L)  11/27/2014     2:47 AM 36 (L)    Possible Clinical Conditions: -Acute kidney injury / acute renal failure -Acute kidney injury on chronic kidney disease (if present, please specify stage of CKD if known) -Other condition (please specify) -Unable to determine at present  Thank you, Mateo Flow, RN [phone] Clinical Documentation Specialist

## 2014-11-28 NOTE — Discharge Instructions (Signed)
You were admitted to the hospital with shortness of breath. While here, we treated you for a COPD exacerbation with breathing treatments, antibiotics, and steroids. We also gave you Lasix to take some fluid out of your lungs. Over the next few days, you should continue to use your breathing treatments every 4-6 hours. It is also important that you continue to take your full course of antibiotics and steroids, even if you are feeling better. Please follow up with your primary doctor.   Chronic Obstructive Pulmonary Disease Chronic obstructive pulmonary disease (COPD) is a common lung problem. In COPD, the flow of air from the lungs is limited. The way your lungs work will probably never return to normal, but there are things you can do to improve your lungs and make yourself feel better. HOME CARE  Take all medicines as told by your doctor.  Avoid medicines or cough syrups that dry up your airway (such as antihistamines) and do not allow you to get rid of thick spit. You do not need to avoid them if told differently by your doctor.  If you smoke, stop. Smoking makes the problem worse.  Avoid being around things that make your breathing worse (like smoke, chemicals, and fumes).  Use oxygen therapy and therapy to help improve your lungs (pulmonary rehabilitation) if told by your doctor. If you need home oxygen therapy, ask your doctor if you should buy a tool to measure your oxygen level (oximeter).  Avoid people who have a sickness you can catch (contagious).  Avoid going outside when it is very hot, cold, or humid.  Eat healthy foods. Eat smaller meals more often. Rest before meals.  Stay active, but remember to also rest.  Make sure to get all the shots (vaccines) your doctor recommends. Ask your doctor if you need a pneumonia shot.  Learn and use tips on how to relax.  Learn and use tips on how to control your breathing as told by your doctor. Try:  Breathing in (inhaling) through your  nose for 1 second. Then, pucker your lips and breath out (exhale) through your lips for 2 seconds.  Putting one hand on your belly (abdomen). Breathe in slowly through your nose for 1 second. Your hand on your belly should move out. Pucker your lips and breathe out slowly through your lips. Your hand on your belly should move in as you breathe out.  Learn and use controlled coughing to clear thick spit from your lungs. The steps are: 1. Lean your head a little forward. 2. Breathe in deeply. 3. Try to hold your breath for 3 seconds. 4. Keep your mouth slightly open while coughing 2 times. 5. Spit any thick spit out into a tissue. 6. Rest and do the steps again 1 or 2 times as needed. GET HELP IF:  You cough up more thick spit than usual.  There is a change in the color or thickness of the spit.  It is harder to breathe than usual.  Your breathing is faster than usual. GET HELP RIGHT AWAY IF:   You have shortness of breath while resting.  You have shortness of breath that stops you from:  Being able to talk.  Doing normal activities.  You chest hurts for longer than 5 minutes.  Your skin color is more blue than usual.  Your pulse oximeter shows that you have low oxygen for longer than 5 minutes. MAKE SURE YOU:   Understand these instructions.  Will watch your condition.  Will get help right away if you are not doing well or get worse. Document Released: 10/26/2007 Document Revised: 09/23/2013 Document Reviewed: 01/03/2013 [facility] Patient Information 2015 [place], [place]. This information is not intended to replace advice given to you by your health care provider. Make sure you discuss any questions you have with your health care provider.

## 2014-11-28 NOTE — Research (Signed)
Patient had signed up for [facility] Discharge Study on 11/27/14. I went back up to patient's room today to do his reading for the study. Patient stated he did not want to do the study. He did not want to stay any longer. I with drew patient from the study at his request.

## 2014-11-28 NOTE — Progress Notes (Signed)
CSW notified by nursing staff that patient resided in a "[facility]."  CSW met with patient- he actually is staying at the [facility]- which is a transitional housing unit that works with homeless veterans. This is an independent living facility.   CSW spoke with Dr. Juanito Doom who indicated that d/c is planned for later this afternoon.  Patient will require a taxi to facility as all ancillary staff at the [facility] have gone for the day. He does not have any family to assist with transportation.  CSW spoke to Toney Sang at the [facility] who confirmed above information and stated that patient is OK for return to facility this evening.  CSW will complete a Chief Strategy Officer for [employer] taxi. Discussed with patient's nurse who will call for taxi once d/c paperwork is completed.  Notified patient of above.  CSW will sign off.  Lorie Phenix. Pauline Good, [place]

## 2014-11-28 NOTE — Progress Notes (Signed)
Inpatient Diabetes Program Recommendations  AACE/ADA: New Consensus Statement on Inpatient Glycemic Control (2013)  Target Ranges:  Prepandial:   less than 140 mg/dL      Peak postprandial:   less than 180 mg/dL (1-2 hours)      Critically ill patients:  140 - 180 mg/dL   Inpatient Diabetes Program Recommendations Insulin - Basal: Increase Lantus to 20 units  Correction (SSI): add HS scale Insulin - Meal Coverage: consider adding 5 units TID with meals per Glycemic Control order set Thank you  Raoul Pitch BSN, RN,CDE Inpatient Diabetes Coordinator [phone] (team pager)

## 2014-11-28 NOTE — Progress Notes (Signed)
Family Medicine Teaching Service Daily Progress Note Intern Pager: [phone]  Patient name: Austin Stephenson Medical record number: [id number] Date of birth: 06/24/1940 Age: 74 y.o. Gender: male  Primary Care Provider: Pcp Not In System Consultants: none Code Status: full (obtained on admission)     Assessment and Plan: Austin Stephenson is a 74 y.o. male presenting with dyspnea. PMH is significant for COPD, CAD s/p CABG at [place] in 2014, DM-2, hypertension, BPD and smoking. Also with elevated creatinine, polycythemia, and elevated troponin.   Acute respiratory failure / COPD: Dyspnea likely a combination of COPD exacerbation and CHF.Patient on albuterol inhaler and neb at home. Unclear if he takes other medication for this. He gets all his meds from [place]. Rhonchi bilaterally on exam with some wheezing in background. Less likely to be infection given stable vital signs and normal CBC. Received Solumedrol 125 mg IV on his way to ED and MgSO4 2gm in ED. Patient sating in high 80's to low 90's on room air ON but up to 98% on 2L by [place]. SOB improved. Still coughing. Spits up little phlegm. Lung exam unchanged from admission.  -Admit to telemetry under attending Dr Mingo Amber -Continue prednisone 50 mg PO daily for 5 days -Continue Duonebs q4h / q2hr prn -Continue Azithromycin 500 mg PO today, then 250 mg daily -supplemental oxygen as needed to keep sats > 88% -Will call his pharmacy ([facility] in [place] to get his med list) - Tessalon 100 mg PO bid for cough. -Will need to be on controller medication if not already  -Increase Tessalon due to patient complaining of no improvement of coughing  CHF: unclear if this is new or old. He has mild JVD with hepatojegular reflex on exam. His CXR significant for interstitial edema. BMP 723 (baseline unknown), Trop 0.05 likely from strain vs. ACS given no chest pain. EKG sinus rythem with RBBB and T-wave inversion in lateral leads. Wt about the same since admission. TTE  pending. -Trend troponin0.05>0.04>0.06 -s/p Lasix 40mg  IV in ED -f/u TTE  -Strict I/Os (-731mls ON) -Daily Weights -Will attempt to get records of cardiac history from [place]  CAD: s/p CABG (triple bypass) in 2014. Patient unsure of medications.  -ASA 81 mg daily -Consider BB and ACI bases on his BP. -Obtain medication hx from his pharmacy  HTN: BP WNL. Patient couldn't tell what he takes at home. -monitor BP -Obtain medication hx from his pharmacy  DM-2: Blood glucose 79 on arrival. He reports running 70's to 80's at home. On long acting insulin 20 units plus 5 units short acting at home. CBG up in high 400's overnight but trended down to 165 after SSI and lantus. - Lantus to 10U daily - SSI - f/u A1c -Monitor CBG  Elevated creatinine: sCr of 1.46 on admission. 1.79 this AM. Unknown baseline. Likely 2/2 lasix -Repeat BMP this PM  Hyponatremia: Na down to 134 this AM (142 on admission). Likely from lasix and elevated blood glucose.  -will check BMP in PM  Polycythemia: Hgb 19.2>18.6 adn HCT 57>56. Likely 2/2 to his COPD.  Bipolar Disorder: on medication per pt but couldn't tell what he takes at home. -Obtain medication hx from his pharmacy  FEN/GI: F: KVO E: replete as needed N: Heart-healthy carb-modified  Prophylaxis:  -subq heparin  Subjective:  Patient was seen this morning and is sitting up in bed. His SOB has improved but he still reports coughing up phlegm. No acute events overnight and vitals have been stable. He is currently breathing  on room air with normal O2 saturation. His glucose has been very fluctuant and peaked at 458. It is currently lowered at 332.   Objective: Temp:  [97.6 F (36.4 C)-98.2 F (36.8 C)] 97.8 F (36.6 C) (07/07 2237) Pulse Rate:  [84-104] 84 (07/08 0653) Resp:  [16-24] 20 (07/08 0653) BP: (119-170)/(59-80) 119/80 mmHg (07/08 0653) SpO2:  [95 %-96 %] 96 % (07/08 0653) Weight:  [201 lb (91.173 kg)] 201 lb (91.173 kg) (07/08  0109) Physical Exam: General: Patient breathing on room air. In NAD Cardiovascular: RRR, no rubs, gallops, or murmurs  Respiratory: No increased work of breathing, rhonchi and wheezing heard in bilateral lung fields.  Abdomen: + BS, non tender to palpation  Extremities: no edema  Laboratory:  Recent Labs Lab 11/26/14 1308 11/27/14 0247  WBC 10.4 11.8*  HGB 19.2* 18.5*  HCT 56.9* 55.7*  PLT 155 171    Recent Labs Lab 11/26/14 1308 11/27/14 0247  NA 142 134*  K 4.3 4.1  CL 110 97*  CO2 25 24  BUN 18 23*  CREATININE 1.46* 1.79*  CALCIUM 8.4* 8.1*  PROT  --  6.2*  BILITOT  --  0.9  ALKPHOS  --  68  ALT  --  36  AST  --  28  GLUCOSE 79 433*    Imaging/Diagnostic Tests: No results found.   Carlyle Dolly, MD 11/28/2014, 7:20 AM PGY-1, [place] Intern pager: [phone], text pages welcome

## 2014-11-28 NOTE — Progress Notes (Signed)
Paged Dr. Juanito Doom regarding pt CBG of [phone]. Orders received to recheck CBG at 1700. Will continue to monitor pt. Pt was told he was going to be discharged. Pt ready and anxious to go home.     Maurene Capes RN

## 2014-11-28 NOTE — Clinical Documentation Improvement (Signed)
"  CHF: unclear if this is old or new" "Also questionable history of CHF - overloaded on admission here".  Please document in your future progress notes and discharge summary the acuity (acute, chronic, acute on chronic) and type of CHF (diastolic, systolic, combined diastolic and systolic) if/when known.  Thank you, Mateo Flow, RN [phone] Clinical Documentation Specialist

## 2014-12-20 ENCOUNTER — Encounter (HOSPITAL_COMMUNITY): Payer: Self-pay | Admitting: Emergency Medicine

## 2014-12-20 ENCOUNTER — Emergency Department (HOSPITAL_COMMUNITY): Payer: Medicare Other

## 2014-12-20 ENCOUNTER — Inpatient Hospital Stay (HOSPITAL_COMMUNITY)
Admission: EM | Admit: 2014-12-20 | Discharge: 2014-12-22 | DRG: 369 | Disposition: A | Discharge status: Home / Self Care | Payer: Medicare Other | Attending: Family Medicine | Admitting: Family Medicine

## 2014-12-20 DIAGNOSIS — I10 Essential (primary) hypertension: Secondary | ICD-10-CM | POA: Diagnosis present

## 2014-12-20 DIAGNOSIS — R079 Chest pain, unspecified: Secondary | ICD-10-CM

## 2014-12-20 DIAGNOSIS — Z79899 Other long term (current) drug therapy: Secondary | ICD-10-CM

## 2014-12-20 DIAGNOSIS — K2210 Ulcer of esophagus without bleeding: Secondary | ICD-10-CM | POA: Diagnosis present

## 2014-12-20 DIAGNOSIS — Z7982 Long term (current) use of aspirin: Secondary | ICD-10-CM

## 2014-12-20 DIAGNOSIS — R110 Nausea: Secondary | ICD-10-CM

## 2014-12-20 DIAGNOSIS — F319 Bipolar disorder, unspecified: Secondary | ICD-10-CM | POA: Diagnosis present

## 2014-12-20 DIAGNOSIS — Z9861 Coronary angioplasty status: Secondary | ICD-10-CM | POA: Diagnosis not present

## 2014-12-20 DIAGNOSIS — E1129 Type 2 diabetes mellitus with other diabetic kidney complication: Secondary | ICD-10-CM | POA: Insufficient documentation

## 2014-12-20 DIAGNOSIS — I472 Ventricular tachycardia: Secondary | ICD-10-CM | POA: Diagnosis not present

## 2014-12-20 DIAGNOSIS — I2510 Atherosclerotic heart disease of native coronary artery without angina pectoris: Secondary | ICD-10-CM | POA: Diagnosis present

## 2014-12-20 DIAGNOSIS — Z7952 Long term (current) use of systemic steroids: Secondary | ICD-10-CM

## 2014-12-20 DIAGNOSIS — E875 Hyperkalemia: Secondary | ICD-10-CM | POA: Diagnosis present

## 2014-12-20 DIAGNOSIS — E118 Type 2 diabetes mellitus with unspecified complications: Secondary | ICD-10-CM | POA: Diagnosis present

## 2014-12-20 DIAGNOSIS — IMO0002 Reserved for concepts with insufficient information to code with codable children: Secondary | ICD-10-CM | POA: Insufficient documentation

## 2014-12-20 DIAGNOSIS — Z951 Presence of aortocoronary bypass graft: Secondary | ICD-10-CM | POA: Diagnosis not present

## 2014-12-20 DIAGNOSIS — N179 Acute kidney failure, unspecified: Secondary | ICD-10-CM | POA: Diagnosis present

## 2014-12-20 DIAGNOSIS — D751 Secondary polycythemia: Secondary | ICD-10-CM | POA: Diagnosis present

## 2014-12-20 DIAGNOSIS — Z794 Long term (current) use of insulin: Secondary | ICD-10-CM

## 2014-12-20 DIAGNOSIS — K226 Gastro-esophageal laceration-hemorrhage syndrome: Secondary | ICD-10-CM | POA: Diagnosis present

## 2014-12-20 DIAGNOSIS — F17210 Nicotine dependence, cigarettes, uncomplicated: Secondary | ICD-10-CM | POA: Diagnosis present

## 2014-12-20 DIAGNOSIS — I959 Hypotension, unspecified: Secondary | ICD-10-CM | POA: Diagnosis present

## 2014-12-20 DIAGNOSIS — J449 Chronic obstructive pulmonary disease, unspecified: Secondary | ICD-10-CM | POA: Diagnosis not present

## 2014-12-20 DIAGNOSIS — R042 Hemoptysis: Secondary | ICD-10-CM

## 2014-12-20 DIAGNOSIS — N183 Chronic kidney disease, stage 3 unspecified: Secondary | ICD-10-CM | POA: Insufficient documentation

## 2014-12-20 DIAGNOSIS — K449 Diaphragmatic hernia without obstruction or gangrene: Secondary | ICD-10-CM | POA: Diagnosis present

## 2014-12-20 DIAGNOSIS — J441 Chronic obstructive pulmonary disease with (acute) exacerbation: Secondary | ICD-10-CM | POA: Diagnosis present

## 2014-12-20 DIAGNOSIS — K920 Hematemesis: Secondary | ICD-10-CM

## 2014-12-20 DIAGNOSIS — E1165 Type 2 diabetes mellitus with hyperglycemia: Secondary | ICD-10-CM

## 2014-12-20 DIAGNOSIS — Z23 Encounter for immunization: Secondary | ICD-10-CM

## 2014-12-20 DIAGNOSIS — N189 Chronic kidney disease, unspecified: Secondary | ICD-10-CM | POA: Diagnosis present

## 2014-12-20 DIAGNOSIS — I252 Old myocardial infarction: Secondary | ICD-10-CM | POA: Diagnosis not present

## 2014-12-20 DIAGNOSIS — I509 Heart failure, unspecified: Secondary | ICD-10-CM | POA: Diagnosis present

## 2014-12-20 LAB — BASIC METABOLIC PANEL
Anion gap: 8 (ref 5–15)
BUN: 86 mg/dL — ABNORMAL HIGH (ref 6–20)
CO2: 27 mmol/L (ref 22–32)
Calcium: 8.3 mg/dL — ABNORMAL LOW (ref 8.9–10.3)
Chloride: 98 mmol/L — ABNORMAL LOW (ref 101–111)
Creatinine, Ser: 1.83 mg/dL — ABNORMAL HIGH (ref 0.61–1.24)
GFR calc non Af Amer: 35 mL/min — ABNORMAL LOW (ref >60)
GFR, EST AFRICAN AMERICAN: 40 mL/min — AB (ref >60)
Glucose, Bld: 509 mg/dL — ABNORMAL HIGH (ref 65–99)
POTASSIUM: 5.5 mmol/L — AB (ref 3.5–5.1)
SODIUM: 133 mmol/L — AB (ref 135–145)

## 2014-12-20 LAB — MRSA PCR SCREENING: MRSA BY PCR: NEGATIVE

## 2014-12-20 LAB — TYPE AND SCREEN
ABO/RH(D): O NEG
ANTIBODY SCREEN: NEGATIVE

## 2014-12-20 LAB — GLUCOSE, CAPILLARY
GLUCOSE-CAPILLARY: 507 mg/dL — AB (ref 65–99)
Glucose-Capillary: 443 mg/dL — ABNORMAL HIGH (ref 65–99)

## 2014-12-20 LAB — COMPREHENSIVE METABOLIC PANEL
ALBUMIN: 3.1 g/dL — AB (ref 3.5–5.0)
ALT: 21 U/L (ref 17–63)
AST: 15 U/L (ref 15–41)
Alkaline Phosphatase: 59 U/L (ref 38–126)
Anion gap: 7 (ref 5–15)
BILIRUBIN TOTAL: 1.2 mg/dL (ref 0.3–1.2)
BUN: 48 mg/dL — ABNORMAL HIGH (ref 6–20)
CHLORIDE: 104 mmol/L (ref 101–111)
CO2: 25 mmol/L (ref 22–32)
Calcium: 8.5 mg/dL — ABNORMAL LOW (ref 8.9–10.3)
Creatinine, Ser: 1.4 mg/dL — ABNORMAL HIGH (ref 0.61–1.24)
GFR calc Af Amer: 56 mL/min — ABNORMAL LOW (ref >60)
GFR, EST NON AFRICAN AMERICAN: 48 mL/min — AB (ref >60)
Glucose, Bld: 259 mg/dL — ABNORMAL HIGH (ref 65–99)
Potassium: 5.7 mmol/L — ABNORMAL HIGH (ref 3.5–5.1)
SODIUM: 136 mmol/L (ref 135–145)
Total Protein: 5.4 g/dL — ABNORMAL LOW (ref 6.5–8.1)

## 2014-12-20 LAB — CBC
HCT: 53.4 % — ABNORMAL HIGH (ref 39.0–52.0)
Hemoglobin: 18 g/dL — ABNORMAL HIGH (ref 13.0–17.0)
MCH: 31.8 pg (ref 26.0–34.0)
MCHC: 33.7 g/dL (ref 30.0–36.0)
MCV: 94.3 fL (ref 78.0–100.0)
PLATELETS: 190 10*3/uL (ref 150–400)
RBC: 5.66 MIL/uL (ref 4.22–5.81)
RDW: 14.3 % (ref 11.5–15.5)
WBC: 14.2 10*3/uL — ABNORMAL HIGH (ref 4.0–10.5)

## 2014-12-20 LAB — PROTIME-INR
INR: 1.15 (ref 0.00–1.49)
Prothrombin Time: 14.9 seconds (ref 11.6–15.2)

## 2014-12-20 LAB — TROPONIN I: Troponin I: 0.07 ng/mL — ABNORMAL HIGH (ref <0.031)

## 2014-12-20 LAB — POTASSIUM: POTASSIUM: 6.1 mmol/L — AB (ref 3.5–5.1)

## 2014-12-20 LAB — ABO/RH: ABO/RH(D): O NEG

## 2014-12-20 MED ORDER — ACETAMINOPHEN 325 MG PO TABS: 650.0000 mg | ORAL_TABLET | Freq: Four times a day (QID) | ORAL | Status: DC | PRN

## 2014-12-20 MED ORDER — PANTOPRAZOLE SODIUM 40 MG IV SOLR
40.0000 mg | Freq: Two times a day (BID) | INTRAVENOUS | Status: DC
  Administered 2014-12-20 – 2014-12-21 (×2): 40 mg via INTRAVENOUS
  Filled 2014-12-20 (×3): qty 40

## 2014-12-20 MED ORDER — INSULIN ASPART 100 UNIT/ML ~~LOC~~ SOLN
9.0000 [IU] | Freq: Once | SUBCUTANEOUS | Status: AC
  Administered 2014-12-20: 9 [IU] via SUBCUTANEOUS

## 2014-12-20 MED ORDER — INSULIN ASPART 100 UNIT/ML ~~LOC~~ SOLN: 11.0000 [IU] | Freq: Once | SUBCUTANEOUS | Status: DC

## 2014-12-20 MED ORDER — IPRATROPIUM-ALBUTEROL 0.5-2.5 (3) MG/3ML IN SOLN
3.0000 mL | Freq: Once | RESPIRATORY_TRACT | Status: AC
  Administered 2014-12-20: 3 mL via RESPIRATORY_TRACT
  Filled 2014-12-20: qty 3

## 2014-12-20 MED ORDER — SODIUM POLYSTYRENE SULFONATE 15 GM/60ML PO SUSP
15.0000 g | Freq: Once | ORAL | Status: AC
  Administered 2014-12-20: 15 g via ORAL
  Filled 2014-12-20: qty 60

## 2014-12-20 MED ORDER — INSULIN GLARGINE 100 UNIT/ML ~~LOC~~ SOLN
10.0000 [IU] | Freq: Every day | SUBCUTANEOUS | Status: DC
  Administered 2014-12-20 – 2014-12-21 (×2): 10 [IU] via SUBCUTANEOUS
  Filled 2014-12-20 (×3): qty 0.1

## 2014-12-20 MED ORDER — IPRATROPIUM-ALBUTEROL 0.5-2.5 (3) MG/3ML IN SOLN
3.0000 mL | Freq: Four times a day (QID) | RESPIRATORY_TRACT | Status: DC | PRN
  Administered 2014-12-21: 3 mL via RESPIRATORY_TRACT
  Filled 2014-12-20: qty 3

## 2014-12-20 MED ORDER — PREDNISONE 20 MG PO TABS
60.0000 mg | ORAL_TABLET | Freq: Once | ORAL | Status: AC
  Administered 2014-12-20: 60 mg via ORAL
  Filled 2014-12-20: qty 3

## 2014-12-20 MED ORDER — INSULIN ASPART 100 UNIT/ML ~~LOC~~ SOLN
0.0000 [IU] | Freq: Every day | SUBCUTANEOUS | Status: DC
  Administered 2014-12-21: 2 [IU] via SUBCUTANEOUS

## 2014-12-20 MED ORDER — PANTOPRAZOLE SODIUM 40 MG PO TBEC: 40.0000 mg | DELAYED_RELEASE_TABLET | Freq: Two times a day (BID) | ORAL | Status: DC

## 2014-12-20 MED ORDER — IOHEXOL 350 MG/ML SOLN
75.0000 mL | Freq: Once | INTRAVENOUS | Status: AC | PRN
  Administered 2014-12-20: 75 mL via INTRAVENOUS

## 2014-12-20 MED ORDER — ACETAMINOPHEN 650 MG RE SUPP: 650.0000 mg | Freq: Four times a day (QID) | RECTAL | Status: DC | PRN

## 2014-12-20 MED ORDER — SODIUM CHLORIDE 0.9 % IV SOLN
INTRAVENOUS | Status: DC
  Administered 2014-12-20: 22:00:00 via INTRAVENOUS

## 2014-12-20 MED ORDER — INSULIN ASPART 100 UNIT/ML ~~LOC~~ SOLN
0.0000 [IU] | Freq: Three times a day (TID) | SUBCUTANEOUS | Status: DC
  Administered 2014-12-21 (×2): 2 [IU] via SUBCUTANEOUS
  Administered 2014-12-22: 9 [IU] via SUBCUTANEOUS
  Administered 2014-12-22: 3 [IU] via SUBCUTANEOUS

## 2014-12-20 MED ORDER — PANTOPRAZOLE SODIUM 40 MG IV SOLR
40.0000 mg | Freq: Once | INTRAVENOUS | Status: AC
  Administered 2014-12-20: 40 mg via INTRAVENOUS
  Filled 2014-12-20: qty 40

## 2014-12-20 MED ORDER — FUROSEMIDE 10 MG/ML IJ SOLN
40.0000 mg | Freq: Once | INTRAMUSCULAR | Status: AC
  Administered 2014-12-20: 40 mg via INTRAVENOUS
  Filled 2014-12-20: qty 4

## 2014-12-20 MED ORDER — POLYETHYLENE GLYCOL 3350 17 G PO PACK
17.0000 g | PACK | Freq: Every day | ORAL | Status: DC | PRN
  Filled 2014-12-20: qty 1

## 2014-12-20 MED ORDER — ONDANSETRON HCL 4 MG/2ML IJ SOLN
4.0000 mg | Freq: Once | INTRAMUSCULAR | Status: AC
  Administered 2014-12-20: 4 mg via INTRAVENOUS
  Filled 2014-12-20: qty 2

## 2014-12-20 MED ORDER — PNEUMOCOCCAL VAC POLYVALENT 25 MCG/0.5ML IJ INJ
0.5000 mL | INJECTION | INTRAMUSCULAR | Status: AC
  Administered 2014-12-21: 0.5 mL via INTRAMUSCULAR
  Filled 2014-12-20: qty 0.5

## 2014-12-20 NOTE — ED Notes (Signed)
CT notified that IV is in place for CT angio

## 2014-12-20 NOTE — ED Notes (Signed)
Pt continues to remove leads & O2 sat probe, leads & O2 monitor replaced, pt verbalizes understanding of need to monitor vitals, secretary asked to call special equipment for a fan per pt request

## 2014-12-20 NOTE — ED Notes (Addendum)
Pt arrives via EMS with c/o hemoptysis ongoing for 8 hours. COPD hx, smokes a pack a day. Dxed with pneumonia earlier this week. Unknown RX. Feels weak, nausea, but no emesis. Resides at [facility] Place for homeless veterans.

## 2014-12-20 NOTE — ED Notes (Signed)
Pt constantly asking for a cup of water and has been told repeatedly that he cannot have water until his examination and lab tests are finished.

## 2014-12-20 NOTE — ED Notes (Signed)
Dr Dina Rich agreed to give the pt a half cup of water. Pt give half cup of water.

## 2014-12-20 NOTE — Consult Note (Signed)
Referring Provider: [facility] -- Chrisandra Netters, MD Primary Care Physician:  Pcp Not In System Primary Gastroenterologist: unassigned  Reason for Consultation:  Hematemesis      HPI: Austin Stephenson is a 74 y.o. male who relocated to [place] from [place], [place] years ago. He has a history of coronary artery disease status post CABG in 2014 at [place], COPD, diabetes type 2 on insulin, polycythemia, and bipolar disorder. He is status post [facility]ation of COPD and CHF. He was treated with steroids,, azithromycin, and DuoNeb. He has been on oral prednisone since discharge and uses aspirin periodically he was feeling fairly well but last night began to vomit blood. He says he felt "queasy" while but had no frank nausea. He has had a cough and when he saw streaks of blood he initially thought it was in his mucus and he came to the ER. Once in the ER he was witnessed vomiting bright red blood. He denies a prior history of ulcers, gastritis, or esophagitis. He says he had a colonoscopy a few  years ago in [place] while he was incarcerated. He is not sure of the findings. He denies dizziness, weakness, lightheadedness, blurred vision. He says his bowel movements have been normal and he has not noted any jet black stools nor has he had bright red blood per rectum. He admits to one or 2 beers per week, denies use of tobacco or intravenous drugs.   Past Medical History  Diagnosis Date  . COPD (chronic obstructive pulmonary disease)   . Hypertension   . Coronary artery disease   . Type II diabetes mellitus   . Myocardial infarction 2014  . CHF (congestive heart failure)   . Pneumonia "several times"  . Chronic bronchitis     "get it just about q yr" (11/26/2014)  . Bipolar disorder     Past Surgical History  Procedure Laterality Date  . Coronary angioplasty    . Tonsillectomy    . Inguinal hernia repair    . Coronary artery bypass  graft  2014    at [place]; "CABG X3"  . Colonoscopy      Prior to Admission medications   Medication Sig Start Date End Date Taking? Authorizing Provider  aspirin 81 MG chewable tablet Chew 1 tablet (81 mg total) by mouth daily. 11/28/14   Vivi Barrack, MD  azithromycin (ZITHROMAX) 250 MG tablet Take 1 tablet (250 mg total) by mouth daily. 11/28/14   Vivi Barrack, MD  guaiFENesin (MUCINEX) 600 MG 12 hr tablet Take 600 mg by mouth 2 (two) times daily as needed for cough.    Historical Provider, MD  predniSONE (DELTASONE) 50 MG tablet Take 1 tablet (50 mg total) by mouth daily with breakfast. 11/28/14   Vivi Barrack, MD    No current facility-administered medications for this encounter.   Current Outpatient Prescriptions  Medication Sig Dispense Refill  . aspirin 81 MG chewable tablet Chew 1 tablet (81 mg total) by mouth daily. 30 tablet 0  . azithromycin (ZITHROMAX) 250 MG tablet Take 1 tablet (250 mg total) by mouth daily. 2 each 0  . guaiFENesin (MUCINEX) 600 MG 12 hr tablet Take 600 mg by mouth 2 (two) times daily as needed for cough.    . predniSONE (DELTASONE) 50 MG tablet Take 1 tablet (50 mg total) by mouth daily with breakfast. 2 tablet 0    Allergies as of 12/20/2014  . (  No Known Allergies)    No family history on file.  History   Social History  . Marital Status: Divorced    Spouse Name: N/A  . Number of Children: N/A  . Years of Education: N/A   Occupational History  . Not on file.   Social History Main Topics  . Smoking status: Current Every Day Smoker -- 1.00 packs/day for 60 years    Types: Cigarettes  . Smokeless tobacco: Never Used  . Alcohol Use: No  . 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 Sexual Activity: No   Other Topics Concern  . Not on file   Social History Narrative    Review of Systems: Gen: Denies any fever, chills, sweats, anorexia,  fatigue, weakness, malaise, weight loss, and sleep disorder CV: Denies chest pain, angina, palpitations, syncope, orthopnea, PND, peripheral edema, and claudication. Resp: Has had a cough and has had some recent shortness of breath. GI: Denies jaundice, and fecal incontinence.   Denies dysphagia or odynophagia. Vomited blood this morning. GU : Denies urinary burning, blood in urine, urinary frequency, urinary hesitancy, nocturnal urination, and urinary incontinence. MS: Denies joint pain, limitation of movement, and swelling, stiffness, low back pain, extremity pain. Denies muscle weakness, cramps, atrophy.  Derm: Denies rash, itching, dry skin, hives, moles, warts, or unhealing ulcers.  Psych: Has a history of bipolar disorder Heme: Denies bruising, bleeding, and enlarged lymph nodes. Neuro:  Denies any headaches, dizziness, paresthesias. Endo:  Denies any problems with thyroid, adrenal function.  Physical Exam: Vital signs in last 24 hours: Temp:  [98.1 F (36.7 C)] 98.1 F (36.7 C) (07/30 0545) Pulse Rate:  [85-100] 90 (07/30 1130) Resp:  [17-35] 24 (07/30 1130) BP: (108-157)/(43-90) 118/52 mmHg (07/30 1130) SpO2:  [87 %-98 %] 93 % (07/30 1130) Weight:  [204 lb (92.534 kg)] 204 lb (92.534 kg) (07/30 0547)   General:   Alert,  Well-developed, well-nourished, pleasant and cooperative in NAD Head:  Normocephalic and atraumatic. Eyes:  Sclera clear, no icterus. Conjunctiva pink. Ears:  Normal auditory acuity. Nose:  No deformity, discharge,  or lesions. Mouth:  No deformity or lesions.   Neck:  Supple; no masses or thyromegaly. Lungs:  Clear throughout to auscultation.    Heart:  Regular rate and rhythm; no murmurs Abdomen:  Soft,nontender, BS active,nonpalp mass or hsm.   Rectal:  Deferred per patient. Msk:  Symmetrical without gross deformities. . Pulses:  Normal pulses noted. Extremities:  Without clubbing or edema. Neurologic:  Alert and  oriented x4;  grossly normal  neurologically. Skin: No rash noted. Psych: Alert and cooperative. Normal mood and affect.  Intake/Output from previous day:   Intake/Output this shift: Total I/O In: -  Out: 650 [Urine:650]  Lab Results:  Recent Labs  12/20/14 0555  WBC 14.2*  HGB 18.0*  HCT 53.4*  PLT 190   BMET  Recent Labs  12/20/14 0555 12/20/14 0740  NA 136  --   K 5.7* 6.1*  CL 104  --   CO2 25  --   GLUCOSE 259*  --   BUN 48*  --   CREATININE 1.40*  --   CALCIUM 8.5*  --    LFT  Recent Labs  12/20/14 0555  PROT 5.4*  ALBUMIN 3.1*  AST 15  ALT 21  ALKPHOS 59  BILITOT 1.2   PT/INR  Recent  Labs  12/20/14 1052  LABPROT 14.9  INR 1.15     Studies/Results: Ct Angio Chest Pe W/cm &/or Wo Cm  12/20/2014   CLINICAL DATA:  74 year old male with acute hemoptysis today.  EXAM: CT ANGIOGRAPHY CHEST WITH CONTRAST  TECHNIQUE: Multidetector CT imaging of the chest was performed using the standard protocol during bolus administration of intravenous contrast. Multiplanar CT image reconstructions and MIPs were obtained to evaluate the vascular anatomy.  CONTRAST:  65mL OMNIPAQUE IOHEXOL 350 MG/ML SOLN  COMPARISON:  12/20/2014 and 11/26/2014 chest radiographs  FINDINGS: This is a technically satisfactory study.  Mediastinum/Nodes: No pulmonary emboli are identified. Previous cardiac surgery/ CABG changes noted. Mild cardiomegaly is present. There is no evidence of thoracic aortic aneurysm. No pericardial effusion or enlarged lymph nodes identified.  Lungs/Pleura: Moderate centrilobular emphysema identified. There is no evidence of airspace disease, mass, nodule or consolidation. No endobronchial or endotracheal lesions are identified. Mild peribronchial thickening noted. Mild dependent/basilar atelectasis identified.  Upper abdomen: Unremarkable  Musculoskeletal: No acute or suspicious abnormalities.  Review of the MIP images confirms the above findings.  IMPRESSION: No evidence of pulmonary emboli or  thoracic aortic aneurysm.  Cardiomegaly and CABG changes.  Moderate emphysema/ COPD with mild dependent/basilar atelectasis.   Electronically Signed   By: Margarette Canada M.D.   On: 12/20/2014 09:58   Dg Chest Portable 1 View  12/20/2014   CLINICAL DATA:  Hemoptysis beginning last night, diaphoresis and fever with shortness of breath today. History of pneumonia, COPD, diabetes, CHF.  EXAM: PORTABLE CHEST - 1 VIEW  COMPARISON:  Chest radiograph November 26, 2014  FINDINGS: Similar cardiomegaly. Status post median sternotomy for CABG. Mild interstitial prominence in the mid and lower lung zone, improved from prior examination. No pleural effusion or focal consolidation. No pneumothorax. Mild degenerative change of the spine.  IMPRESSION: Stable cardiomegaly. Decreased interstitial prominence suggests resolving atypical infection without focal consolidation.   Electronically Signed   By: Elon Alas M.D.   On: 12/20/2014 06:43    IMPRESSION/PLAN: 74 year old male with a history of COPD, CAD, diabetes, hypertension, polycythemia, and bipolar disorder , who presented to ER with hematemesis. Patient currently hemodynamically stable. May have ice chips. PPI. IV fluids. Trend H&H. We will plan on endoscopy to evaluate for possible Mallory-Weiss tear, esophagitis, gastritis, ulcer, etc. tomorrow morning.The risks, benefits, and alternatives to endoscopy with possible biopsy and possible dilation were discussed with the patient and they consent to proceed.    Hvozdovic, Deloris Ping 12/20/2014,  Pager [phone]    Attending physician's note   I have taken an interval history, reviewed the chart and examined the patient. I agree with the Advanced Practitioner's note, impression and recommendations. Presented to ED for hemoptysis as pt noted cough with streaks of blood at home. During ED evaluation bloody emesis was witnessed by ED staff.  Pt notes nausea but no other GI symptoms. R/O MW tear, ulcer, esophagitis, etc.  EGD tomorrow. Primary service to determine if further evaluation of hemoptysis is needed. IV PPI, NPO except ice chips, trend Hb/Hct.  Pricilla Riffle. Fuller Plan, MD [facility] [phone] pager Mon-Fri 8a-5p [phone] weekends, holidays and 5p-8a or per [facility]

## 2014-12-20 NOTE — ED Notes (Signed)
Contacted GI for Dr.Walden [phone]

## 2014-12-20 NOTE — ED Notes (Signed)
Pt has fan at bedside

## 2014-12-20 NOTE — ED Provider Notes (Signed)
CSN: [id number]     Arrival date & time 12/20/14  0540 History   First MD Initiated Contact with Patient 12/20/14 0602     Chief Complaint  Patient presents with  . Hemoptysis     (Consider location/radiation/quality/duration/timing/severity/associated sxs/prior Treatment) HPI Patient is a 74 year old male past history of hypertension, diabetes, COPD, CAD, who presents the ER complaining of hemoptysis. Patient was admitted to the hospital earlier this month for a COPD exacerbation, treated for pneumonia. Patient reports feeling better for several weeks, states over the past several weeks he has had increased shortness of breath, and over the past 24 hours has been experiencing hemoptysis, productive cough which has been worsening. Patient reports mild shortness of breath which she states she does not feel as different from baseline. Patient denies chest pain, fever, nausea, vomiting, headache, blurred vision, dizziness, weakness, diarrhea, dysuria.  Past Medical History  Diagnosis Date  . COPD (chronic obstructive pulmonary disease)   . Hypertension   . Coronary artery disease   . Type II diabetes mellitus   . Myocardial infarction 2014  . CHF (congestive heart failure)   . Pneumonia "several times"  . Chronic bronchitis     "get it just about q yr" (11/26/2014)  . Bipolar disorder    Past Surgical History  Procedure Laterality Date  . Coronary angioplasty    . Tonsillectomy    . Inguinal hernia repair    . Coronary artery bypass graft  2014    at [place]; "CABG X3"  . Colonoscopy     No family history on file. History  Substance Use Topics  . Smoking status: Current Every Day Smoker -- 1.00 packs/day for 60 years    Types: Cigarettes  . Smokeless tobacco: Never Used  . Alcohol Use: No    Review of Systems  Constitutional: Negative for fever.  HENT: Negative for trouble swallowing.   Eyes: Negative for visual disturbance.  Respiratory: Positive for cough and shortness of  breath.        Hemoptysis  Cardiovascular: Negative for chest pain.  Gastrointestinal: Negative for nausea, vomiting and abdominal pain.  Genitourinary: Negative for dysuria.  Musculoskeletal: Negative for neck pain.  Skin: Negative for rash.  Neurological: Negative for dizziness, weakness and numbness.  Psychiatric/Behavioral: Negative.     Allergies  Review of patient's allergies indicates no known allergies.  Home Medications   Prior to Admission medications   Medication Sig Start Date End Date Taking? Authorizing Provider  aspirin 81 MG chewable tablet Chew 1 tablet (81 mg total) by mouth daily. 11/28/14   Vivi Barrack, MD  azithromycin (ZITHROMAX) 250 MG tablet Take 1 tablet (250 mg total) by mouth daily. 11/28/14   Vivi Barrack, MD  guaiFENesin (MUCINEX) 600 MG 12 hr tablet Take 600 mg by mouth 2 (two) times daily as needed for cough.    Historical Provider, MD  predniSONE (DELTASONE) 50 MG tablet Take 1 tablet (50 mg total) by mouth daily with breakfast. 11/28/14   Vivi Barrack, MD   BP 118/52 mmHg  Pulse 90  Temp(Src) 98.1 F (36.7 C) (Oral)  Resp 24  Ht 5\' 10"  (1.778 m)  Wt 192 lb 7.4 oz (87.3 kg)  BMI 27.62 kg/m2  SpO2 93% Physical Exam  Constitutional: He is oriented to person, place, and time. He appears well-developed and well-nourished. No distress.  HENT:  Head: Normocephalic and atraumatic.  Mouth/Throat: Oropharynx is clear and moist. No oropharyngeal exudate.  Eyes: Right eye exhibits no  discharge. Left eye exhibits no discharge. No scleral icterus.  Neck: Normal range of motion.  Cardiovascular: Normal rate, regular rhythm and normal heart sounds.   No murmur heard. Pulmonary/Chest: Effort normal and breath sounds normal. No accessory muscle usage. Tachypnea noted. No respiratory distress.  Abdominal: Soft. There is no tenderness.  Musculoskeletal: Normal range of motion. He exhibits no edema or tenderness.  Neurological: He is alert and oriented to  person, place, and time. No cranial nerve deficit. Coordination normal.  Skin: Skin is warm and dry. No rash noted. He is not diaphoretic.  Psychiatric: He has a normal mood and affect.  Nursing note and vitals reviewed.   ED Course  Procedures (including critical care time) Labs Review Labs Reviewed  COMPREHENSIVE METABOLIC PANEL - Abnormal; Notable for the following:    Potassium 5.7 (*)    Glucose, Bld 259 (*)    BUN 48 (*)    Creatinine, Ser 1.40 (*)    Calcium 8.5 (*)    Total Protein 5.4 (*)    Albumin 3.1 (*)    GFR calc non Af Amer 48 (*)    GFR calc Af Amer 56 (*)    All other components within normal limits  CBC - Abnormal; Notable for the following:    WBC 14.2 (*)    Hemoglobin 18.0 (*)    HCT 53.4 (*)    All other components within normal limits  TROPONIN I - Abnormal; Notable for the following:    Troponin I 0.07 (*)    All other components within normal limits  POTASSIUM - Abnormal; Notable for the following:    Potassium 6.1 (*)    All other components within normal limits  BASIC METABOLIC PANEL - Abnormal; Notable for the following:    Sodium 133 (*)    Potassium 5.5 (*)    Chloride 98 (*)    Glucose, Bld 509 (*)    BUN 86 (*)    Creatinine, Ser 1.83 (*)    Calcium 8.3 (*)    GFR calc non Af Amer 35 (*)    GFR calc Af Amer 40 (*)    All other components within normal limits  MRSA PCR SCREENING  PROTIME-INR  TYPE AND SCREEN  ABO/RH    Imaging Review Ct Angio Chest Pe W/cm &/or Wo Cm  12/20/2014   CLINICAL DATA:  74 year old male with acute hemoptysis today.  EXAM: CT ANGIOGRAPHY CHEST WITH CONTRAST  TECHNIQUE: Multidetector CT imaging of the chest was performed using the standard protocol during bolus administration of intravenous contrast. Multiplanar CT image reconstructions and MIPs were obtained to evaluate the vascular anatomy.  CONTRAST:  14mL OMNIPAQUE IOHEXOL 350 MG/ML SOLN  COMPARISON:  12/20/2014 and 11/26/2014 chest radiographs  FINDINGS:  This is a technically satisfactory study.  Mediastinum/Nodes: No pulmonary emboli are identified. Previous cardiac surgery/ CABG changes noted. Mild cardiomegaly is present. There is no evidence of thoracic aortic aneurysm. No pericardial effusion or enlarged lymph nodes identified.  Lungs/Pleura: Moderate centrilobular emphysema identified. There is no evidence of airspace disease, mass, nodule or consolidation. No endobronchial or endotracheal lesions are identified. Mild peribronchial thickening noted. Mild dependent/basilar atelectasis identified.  Upper abdomen: Unremarkable  Musculoskeletal: No acute or suspicious abnormalities.  Review of the MIP images confirms the above findings.  IMPRESSION: No evidence of pulmonary emboli or thoracic aortic aneurysm.  Cardiomegaly and CABG changes.  Moderate emphysema/ COPD with mild dependent/basilar atelectasis.   Electronically Signed   By: Cleatis Polka.D.  On: 12/20/2014 09:58   Dg Chest Portable 1 View  12/20/2014   CLINICAL DATA:  Hemoptysis beginning last night, diaphoresis and fever with shortness of breath today. History of pneumonia, COPD, diabetes, CHF.  EXAM: PORTABLE CHEST - 1 VIEW  COMPARISON:  Chest radiograph November 26, 2014  FINDINGS: Similar cardiomegaly. Status post median sternotomy for CABG. Mild interstitial prominence in the mid and lower lung zone, improved from prior examination. No pleural effusion or focal consolidation. No pneumothorax. Mild degenerative change of the spine.  IMPRESSION: Stable cardiomegaly. Decreased interstitial prominence suggests resolving atypical infection without focal consolidation.   Electronically Signed   By: Elon Alas M.D.   On: 12/20/2014 06:43     EKG Interpretation   Date/Time:  Saturday December 20 2014 07:15:32 EDT Ventricular Rate:  87 PR Interval:  165 QRS Duration: 110 QT Interval:  374 QTC Calculation: 450 R Axis:   9 Text Interpretation:  Sinus rhythm Probable left atrial enlargement   Abnormal R-wave progression, early transition Abnrm T, consider ischemia,  anterolateral lds similar to prior Confirmed by HORTON  MD, [place]  ([id number]) on 12/20/2014 7:24:05 AM      MDM   Final diagnoses:  Hemoptysis    Patient reporting hemoptysis past several weeks. Patient is noted to be coughing up mucus with red streaks in it. Patient has audible wheezes and rhonchi on exam. Albuterol treatments given. Given patient's history of hemoptysis, smoking history and mild tachypnea, patient was followed up with CT angiogram of chest for rule out of PE. CT did not show evidence of PE, however patient was noted to be hyperkalemic at 5.7. This was reassessed and noted to be 6.1. Patient noted to have elevated troponin, this appears to be at baseline for patient. Patient continued to cough up blood, had one episode of hematemesis which was differentiated as there is noted to have food particles in it. Patient admitted to family medicine service for COPD exacerbation, hematemesis and hyperkalemia in the setting of COPD exacerbation. Spoke with family medicine who agrees to admit. Spoke with Dr. Fuller Plan with gastroenterology who agrees with plan. Gastroenterology to consult on patient. The patient appears reasonably stabilized for admission considering the current resources, flow, and capabilities available in the ED at this time, and I doubt any other [facility] requiring further screening and/or treatment in the ED prior to admission.  Signed,  Dahlia Bailiff, PA-C 4:44 PM  Patient seen and discussed with Dr. Thayer Jew, M.D.   Dahlia Bailiff, PA-C 12/20/14 [place], MD 12/21/14 [phone]

## 2014-12-20 NOTE — Progress Notes (Signed)
Seen and examined.  I have spoken with my residents and we have agreed on a management plan.  I will co sign the H&PE with it is completed.  Briefly: 74 yo male with sig. PMH and long problem list which includes COPD presents with hemoptosis/hematemesis.   Patient states that he has been coughing for months.  Today, noticed blood when he spit out.  Came to ER and vomited x1, which was bloody.  He had clearly observed hematemesis by medical personnel.  Problems: 1. Hematemesis: GI already seeing and plans to do EGD tomorrow.  No current active bleeding.  Clear liquid diet.  Coags OK.  Follow Hgb.  PPI 2. Hemoptosis:  CXR and CT angio reassuring no mass lesion.  Does not seem to have COPD exac. (Mild exp wheeze and prolonged exp phase).  No active bleeding.  Observe for now.    For both: hold ASA which he takes for his CAD.

## 2014-12-20 NOTE — ED Notes (Signed)
Truitt Leep, [place] at bedside informing pt that he cannot have anything by mouth until his CT angio results are available

## 2014-12-20 NOTE — ED Notes (Signed)
Pt returned from [place], pt refuses to use the bed pan, pt attempting to walk to restroom, pt advised to wait for wheelchair, CT staff remains with pt until this RN wheeled him to the bathroom

## 2014-12-20 NOTE — ED Notes (Signed)
Pt opened door and took all equipment off and upset over not being able to have water. Dr Dina Rich and this RN  In room to talk with patient.

## 2014-12-20 NOTE — H&P (Signed)
[facility] Admission History and Physical Service Pager: [phone]  Patient name: Austin Stephenson Medical record number: [id number] Date of birth: Mar 21, 1941 Age: 74 y.o. Gender: male  Primary Care Provider: Pcp Not In System [facility] Consultants: GI Code Status: Full (per discussion on admission)  Chief Complaint: coughing up blood  Assessment and Plan: Austin Stephenson is a 74 y.o. male presenting with hematemesis vs hemoptysis. PMH is significant for COPD, CHF, CAD s/p CABG at [place] in 2014, DM-2, hypertension, BPD and smoking.    Hemoptysis: Hemoptysis since 7/29 per patient. No hemoptysis observed in the hospital yet. Was admitted on 7/6 for respiratory failure with known COPD. Could be due to excessive coughing from chronic bronchitis secondary to cigarette smoking.  Could be bronchiectasis secondary to prior pneumonia, excessive sputum has not been witnessed so far in the hospital. Most likely not active pneumonia, patient is afebrile and CXR shows resolves atypical infection (WBC slightly elevated at 14.2, however has received steroids). Most likely not lung cancer, CXR did not reveal any nodules and CT angio was reassuring. CTA negative for pulmonary emboli or thoracic aortic aneurysm. Patient is HDS with a stable Hgb. INR was normal.  -Admit to telemetry under Dr. Ardelia Mems  -Monitor respiratory status - Currently on 3L [place] (no O2 at home) -Hold ASA given concerns for bleed -Observe sputum for hemoptysis - F/u CBC  -Consider Tessalon if patient's coughing doesn't improve  Hematemesis: First episode of hematemesis was when he arrived to the ED; his vomit was seen with bright red blood in it. Denies black or tarry stools. No hematochezia. Will need to r/o PUD, Mallory-Weis tears, or varices (denies alcohol use and nml LFT so less likely). Pt has history of smoking for 50-60 years, no NSAID use but is on ASA. Will need to r/o varices or portal hypertension although  patient does not have history of hepatic disease.  -GI on board, appreciate their assistance -Continue Protonix IV BID -Monitor for hemodynamic instability -Monitor hgb -clear liquid diet (transitioning to NPO with ice chips per GI) -Endoscopy tomorrow morning  COPD: chronic. Patient on albuterol inhaler and neb at home. Duoneb and prednisone given in ED. This does not seem like a clear cut COPD exacerbation.  -Continue Duoneb q6 PRN -Given poor control of respiratory symptoms, consider Spiriva or other daily mediation.  -Monitor respiratory status as above  Hyperkalemia: 6.1 on admission. Kayexalte given in ED, and it decreased to 5.5. No T wave abnormalities on EKG.  - Repeat CMP -EKG in AM  CHF: unclear if this is new or old as patient has poor insight into medical history. He was found to have CHF on last admission 7/6 with CXR revealing interstitial edema, BNP 723.8 (baseline unknown), and Echo with EF of 45-50% plus septal and inferior wall hypokinesis of LV (no prior Echo to compare). EKG showed T wave inversions last admission and this admission. Is not taking any medications for CHF. Patient received Lasix in the ED. No evidence of fluid overload. -Monitor for symptoms of fluid overload -IVFs cautiously if needed.  - obtain records if possible ([facility]): consider BB and/or ACEi   CAD: s/p CABG (triple bypass) in 2014. Patient unsure of medications.  -ASA 81 mg daily (will hold for now) - On weekday, consider contacting [place] for medical records (was attempted at previous visit) - Given h/o, consider a statin in this pt.   T2DM: Blood glucose 259 on arrival. He reports running high at "400-500 something". Lantus 20  units plus 5 units short acting at home. A1C  -Begin Lantus to 10U daily - SSI -CBGs -pending A1c  Bipolar Disorder: In records but patient cannot remember which medication he is on  -Obtain medication history from pharmacy  FEN/GI: NPO with permissive ice  chips Prophylaxis: SCDs, no pharmacologic intervention at this time secondary to bleeding  Disposition: pending work up  History of Present Illness: Austin Stephenson is a 74 y.o. male presenting with hemoptysis since last night.  Initially noted cough with small amount of bright red blood, but then describes waking up with a "mouth full of blood."  He describes the blood as bright red and notes it was all over his pillow and in his mouth when he woke up this morning. He called the ambulance because of the blood and because he was short of breath. He already has a chronic cough but claims this is the first time he noted blood in it. He was recently admitted for acute respiratory failure on 7/6 and was treated with Azithromycin, Duoneb, and steroids. He cannot remember any of the medications he is taking at home now. Unsure if his breathing ever improved during/after the previous hospitalization.  Denies constipation, diarrhea,  hematochezia or dark stools. Denies abdominal pain. Last BM was this morning. Denies chronic alcohol use and the last time he states he had a drink was 1-2 beers on Sunday. He admits to smoking 1 PPD for about 50-60 years and says he knows he needs to quit. He denies needing a nicotine patch and he wants to "quit on my own". States he feels hot today and admits to "always sweating". Denies feeling feverish/chills. Admits to SOB and wheezing but seems stable with stable mucous production.  Admits to polyuria without dysuria. He checks his glucose at home and it's usually "400-500 something".   Interestingly, the patient denies nausea and vomiting until he came to the hospital where he was noted to have blood in an emesis that contained brocolli (he thought he was coughing up blood but EDP noted food content and felt it was more emesis related). GI was consulted. Protonix was started. K was noted to be elevated at 6.1. EKG revealed T- wave abnormalities but was unchanged in comparison to  a previous EKG. CTA ruled out PE. He was given  Kayexalate 15g. He was also given Lasix 40mg , a Duoneb, Zofran, and prednisone 60mg .   Review Of Systems: Per HPI with the following additions: no numbness/tingling Otherwise 12 point review of systems was performed and was unremarkable.  Patient Active Problem List   Diagnosis Date Noted  . Hematemesis 12/20/2014  . Acute pulmonary edema   . COPD exacerbation   . History of coronary artery bypass graft   . Essential hypertension   . Dyspnea 11/26/2014   Past Medical History: Past Medical History  Diagnosis Date  . COPD (chronic obstructive pulmonary disease)   . Hypertension   . Coronary artery disease   . Type II diabetes mellitus   . Myocardial infarction 2014  . CHF (congestive heart failure)   . Pneumonia "several times"  . Chronic bronchitis     "get it just about q yr" (11/26/2014)  . Bipolar disorder    Past Surgical History: Past Surgical History  Procedure Laterality Date  . Coronary angioplasty    . Tonsillectomy    . Inguinal hernia repair    . Coronary artery bypass graft  2014    at [place]; "CABG X3"  . Colonoscopy  Social History: History  Substance Use Topics  . Smoking status: Current Every Day Smoker -- 1.00 packs/day for 60 years    Types: Cigarettes  . Smokeless tobacco: Never Used  . Alcohol Use: No   Additional social history: drinks "2-3 beers a week"  Please also refer to relevant sections of EMR.  Family History: No family history on file. Allergies and Medications: No Known Allergies No current facility-administered medications on file prior to encounter.   Current Outpatient Prescriptions on File Prior to Encounter  Medication Sig Dispense Refill  . aspirin 81 MG chewable tablet Chew 1 tablet (81 mg total) by mouth daily. 30 tablet 0  . azithromycin (ZITHROMAX) 250 MG tablet Take 1 tablet (250 mg total) by mouth daily. 2 each 0  . guaiFENesin (MUCINEX) 600 MG 12 hr tablet Take 600 mg  by mouth 2 (two) times daily as needed for cough.    . predniSONE (DELTASONE) 50 MG tablet Take 1 tablet (50 mg total) by mouth daily with breakfast. 2 tablet 0    Objective: BP 118/52 mmHg  Pulse 90  Temp(Src) 98.1 F (36.7 C) (Oral)  Resp 24  Ht 5\' 10"  (1.778 m)  Wt 192 lb 7.4 oz (87.3 kg)  BMI 27.62 kg/m2  SpO2 93% Exam: General: In NAD, patient breathing on room air Does have all the blankets off endorsing being quite thirsty and hot.  Eyes: PERRLA ENTM: dry mucous membranes, no blood seen in mouth however noted to have some dry blood around his mouth, poor dentition, no pharyngeal erythema Neck: supple Cardiovascular: RRR, normal s1 and s2, no RGM No pitting edema or JVD.  Respiratory: No increased work of breathing, expiratory wheezing  heard in bilateral lung fields with a prolonged expiratory phase, diminished sounds at bases Abdomen: + BS, non tender to palpation MSK: full ROM Skin: no lesions or rash appreciated. Ecchymotic area on lower abdomen Neuro: Alert and conversational, grossly intact Psych: normal affect and thought content  Labs and Imaging: CBC BMET   Recent Labs Lab 12/20/14 0555  WBC 14.2*  HGB 18.0*  HCT 53.4*  PLT 190    Recent Labs Lab 12/20/14 1536  NA 133*  K 5.5*  CL 98*  CO2 27  BUN 86*  CREATININE 1.83*  GLUCOSE 509*  CALCIUM 8.3*     Ct Angio Chest Pe W/cm &/or Wo Cm  12/20/2014   CLINICAL DATA:  74 year old male with acute hemoptysis today.  EXAM: CT ANGIOGRAPHY CHEST WITH CONTRAST  TECHNIQUE: Multidetector CT imaging of the chest was performed using the standard protocol during bolus administration of intravenous contrast. Multiplanar CT image reconstructions and MIPs were obtained to evaluate the vascular anatomy.  CONTRAST:  48mL OMNIPAQUE IOHEXOL 350 MG/ML SOLN  COMPARISON:  12/20/2014 and 11/26/2014 chest radiographs  FINDINGS: This is a technically satisfactory study.  Mediastinum/Nodes: No pulmonary emboli are  identified. Previous cardiac surgery/ CABG changes noted. Mild cardiomegaly is present. There is no evidence of thoracic aortic aneurysm. No pericardial effusion or enlarged lymph nodes identified.  Lungs/Pleura: Moderate centrilobular emphysema identified. There is no evidence of airspace disease, mass, nodule or consolidation. No endobronchial or endotracheal lesions are identified. Mild peribronchial thickening noted. Mild dependent/basilar atelectasis identified.  Upper abdomen: Unremarkable  Musculoskeletal: No acute or suspicious abnormalities.  Review of the MIP images confirms the above findings.  IMPRESSION: No evidence of pulmonary emboli or thoracic aortic aneurysm.  Cardiomegaly and CABG changes.  Moderate emphysema/ COPD with mild dependent/basilar atelectasis.   Electronically  Signed   By: Margarette Canada M.D.   On: 12/20/2014 09:58   Dg Chest Portable 1 View  12/20/2014   CLINICAL DATA:  Hemoptysis beginning last night, diaphoresis and fever with shortness of breath today. History of pneumonia, COPD, diabetes, CHF.  EXAM: PORTABLE CHEST - 1 VIEW  COMPARISON:  Chest radiograph November 26, 2014  FINDINGS: Similar cardiomegaly. Status post median sternotomy for CABG. Mild interstitial prominence in the mid and lower lung zone, improved from prior examination. No pleural effusion or focal consolidation. No pneumothorax. Mild degenerative change of the spine.  IMPRESSION: Stable cardiomegaly. Decreased interstitial prominence suggests resolving atypical infection without focal consolidation.   Electronically Signed   By: Elon Alas M.D.   On: 12/20/2014 06:43   EKG: NSR. T wave inversions seen in Lead 1, avr, avl, v1, v2, and v3 but stable. No ST elevation/depression  Carlyle Dolly, MD 12/20/2014, 12:51 PM PGY-1, [place] Intern pager: [phone], text pages welcome   Upper Level Addendum:  I have seen and evaluated this patient along with Dr. Juanito Doom and reviewed  the above note, making necessary revisions in purple.   Archie Patten, MD [place] Resident, PGY-2

## 2014-12-21 ENCOUNTER — Encounter (HOSPITAL_COMMUNITY)
Admission: EM | Disposition: A | Discharge status: Home / Self Care | Payer: Self-pay | Source: Home / Self Care | Attending: Family Medicine

## 2014-12-21 ENCOUNTER — Encounter (HOSPITAL_COMMUNITY): Payer: Self-pay | Admitting: *Deleted

## 2014-12-21 DIAGNOSIS — E1165 Type 2 diabetes mellitus with hyperglycemia: Secondary | ICD-10-CM

## 2014-12-21 DIAGNOSIS — E1129 Type 2 diabetes mellitus with other diabetic kidney complication: Secondary | ICD-10-CM

## 2014-12-21 DIAGNOSIS — N183 Chronic kidney disease, stage 3 unspecified: Secondary | ICD-10-CM | POA: Insufficient documentation

## 2014-12-21 DIAGNOSIS — IMO0002 Reserved for concepts with insufficient information to code with codable children: Secondary | ICD-10-CM | POA: Insufficient documentation

## 2014-12-21 DIAGNOSIS — K2210 Ulcer of esophagus without bleeding: Secondary | ICD-10-CM

## 2014-12-21 DIAGNOSIS — K226 Gastro-esophageal laceration-hemorrhage syndrome: Secondary | ICD-10-CM | POA: Diagnosis not present

## 2014-12-21 HISTORY — PX: ESOPHAGOGASTRODUODENOSCOPY: SHX5428

## 2014-12-21 LAB — BASIC METABOLIC PANEL
Anion gap: 10 (ref 5–15)
BUN: 99 mg/dL — ABNORMAL HIGH (ref 6–20)
CALCIUM: 8.6 mg/dL — AB (ref 8.9–10.3)
CO2: 24 mmol/L (ref 22–32)
Chloride: 103 mmol/L (ref 101–111)
Creatinine, Ser: 1.88 mg/dL — ABNORMAL HIGH (ref 0.61–1.24)
GFR calc Af Amer: 39 mL/min — ABNORMAL LOW (ref >60)
GFR calc non Af Amer: 34 mL/min — ABNORMAL LOW (ref >60)
GLUCOSE: 259 mg/dL — AB (ref 65–99)
Potassium: 4.2 mmol/L (ref 3.5–5.1)
Sodium: 137 mmol/L (ref 135–145)

## 2014-12-21 LAB — LACTIC ACID, PLASMA
LACTIC ACID, VENOUS: 2.1 mmol/L — AB (ref 0.5–2.0)
Lactic Acid, Venous: 1.8 mmol/L (ref 0.5–2.0)

## 2014-12-21 LAB — GLUCOSE, CAPILLARY
GLUCOSE-CAPILLARY: 223 mg/dL — AB (ref 65–99)
Glucose-Capillary: 194 mg/dL — ABNORMAL HIGH (ref 65–99)

## 2014-12-21 LAB — CBC
HCT: 48.1 % (ref 39.0–52.0)
HEMOGLOBIN: 16.5 g/dL (ref 13.0–17.0)
MCH: 31.9 pg (ref 26.0–34.0)
MCHC: 34.3 g/dL (ref 30.0–36.0)
MCV: 92.9 fL (ref 78.0–100.0)
Platelets: 220 10*3/uL (ref 150–400)
RBC: 5.18 MIL/uL (ref 4.22–5.81)
RDW: 14.3 % (ref 11.5–15.5)
WBC: 23.2 10*3/uL — ABNORMAL HIGH (ref 4.0–10.5)

## 2014-12-21 SURGERY — EGD (ESOPHAGOGASTRODUODENOSCOPY)
Anesthesia: Moderate Sedation

## 2014-12-21 MED ORDER — IPRATROPIUM-ALBUTEROL 0.5-2.5 (3) MG/3ML IN SOLN
3.0000 mL | RESPIRATORY_TRACT | Status: DC
  Administered 2014-12-21 – 2014-12-22 (×4): 3 mL via RESPIRATORY_TRACT
  Filled 2014-12-21 (×2): qty 3
  Filled 2014-12-21: qty 42
  Filled 2014-12-21: qty 3

## 2014-12-21 MED ORDER — SODIUM CHLORIDE 0.9 % IV BOLUS (SEPSIS)
500.0000 mL | Freq: Once | INTRAVENOUS | Status: AC
  Administered 2014-12-21: 500 mL via INTRAVENOUS

## 2014-12-21 MED ORDER — SODIUM CHLORIDE 0.9 % IV SOLN
INTRAVENOUS | Status: AC
  Administered 2014-12-22: 05:00:00 via INTRAVENOUS

## 2014-12-21 MED ORDER — FENTANYL CITRATE (PF) 100 MCG/2ML IJ SOLN
INTRAMUSCULAR | Status: DC | PRN
  Administered 2014-12-21 (×2): 25 ug via INTRAVENOUS

## 2014-12-21 MED ORDER — MIDAZOLAM HCL 10 MG/2ML IJ SOLN
INTRAMUSCULAR | Status: DC | PRN
  Administered 2014-12-21: 1 mg via INTRAVENOUS
  Administered 2014-12-21 (×2): 2 mg via INTRAVENOUS

## 2014-12-21 MED ORDER — DIPHENHYDRAMINE HCL 50 MG/ML IJ SOLN
INTRAMUSCULAR | Status: AC
  Filled 2014-12-21: qty 1

## 2014-12-21 MED ORDER — PANTOPRAZOLE SODIUM 40 MG PO TBEC
40.0000 mg | DELAYED_RELEASE_TABLET | Freq: Every day | ORAL | Status: DC
  Administered 2014-12-22: 40 mg via ORAL
  Filled 2014-12-21: qty 1

## 2014-12-21 MED ORDER — CETYLPYRIDINIUM CHLORIDE 0.05 % MT LIQD
7.0000 mL | Freq: Two times a day (BID) | OROMUCOSAL | Status: DC
  Administered 2014-12-21 – 2014-12-22 (×3): 7 mL via OROMUCOSAL

## 2014-12-21 MED ORDER — NICOTINE 21 MG/24HR TD PT24
21.0000 mg | MEDICATED_PATCH | Freq: Every day | TRANSDERMAL | Status: DC
  Administered 2014-12-21 – 2014-12-22 (×2): 21 mg via TRANSDERMAL
  Filled 2014-12-21 (×2): qty 1

## 2014-12-21 MED ORDER — FENTANYL CITRATE (PF) 100 MCG/2ML IJ SOLN
INTRAMUSCULAR | Status: AC
  Filled 2014-12-21: qty 2

## 2014-12-21 MED ORDER — DOXYCYCLINE HYCLATE 100 MG PO TABS
100.0000 mg | ORAL_TABLET | Freq: Two times a day (BID) | ORAL | Status: DC
  Administered 2014-12-22 (×2): 100 mg via ORAL
  Filled 2014-12-21 (×3): qty 1

## 2014-12-21 MED ORDER — BUTAMBEN-TETRACAINE-BENZOCAINE 2-2-14 % EX AERO
INHALATION_SPRAY | CUTANEOUS | Status: DC | PRN
  Administered 2014-12-21: 2 via TOPICAL

## 2014-12-21 MED ORDER — PREDNISONE 50 MG PO TABS
50.0000 mg | ORAL_TABLET | Freq: Every day | ORAL | Status: DC
  Administered 2014-12-21 – 2014-12-22 (×2): 50 mg via ORAL
  Filled 2014-12-21 (×3): qty 1

## 2014-12-21 MED ORDER — BENZONATATE 100 MG PO CAPS
200.0000 mg | ORAL_CAPSULE | Freq: Three times a day (TID) | ORAL | Status: DC
  Administered 2014-12-22 (×2): 200 mg via ORAL
  Filled 2014-12-21 (×4): qty 2

## 2014-12-21 MED ORDER — MIDAZOLAM HCL 5 MG/ML IJ SOLN
INTRAMUSCULAR | Status: AC
  Filled 2014-12-21: qty 2

## 2014-12-21 NOTE — Interval H&P Note (Signed)
History and Physical Interval Note:  12/21/2014 9:26 AM  Austin Stephenson  has presented today for surgery, with the diagnosis of hematemesis  The various methods of treatment have been discussed with the patient and family. After consideration of risks, benefits and other options for treatment, the patient has consented to  Procedure(s): ESOPHAGOGASTRODUODENOSCOPY (EGD) (N/A) as a surgical intervention .  The patient's history has been reviewed, patient examined, no change in status, stable for surgery.  I have reviewed the patient's chart and labs.  Questions were answered to the patient's satisfaction.     Pricilla Riffle. Fuller Plan

## 2014-12-21 NOTE — Progress Notes (Signed)
Patient's Social Worker would like to talk to the MD to get an update.  Her name is Gwenyth Allegra [phone].

## 2014-12-21 NOTE — Progress Notes (Signed)
CRITICAL VALUE ALERT  Critical value received:  Lactic Acid 2.1  Date of notification: 12/21/2014   Time of notification: 3:16 PM   Critical value read back: yes  Nurse who received alert:  Donnella Bi, RN  MD notified (1st page):[phone]  Time of first page:  3:17 PM   MD notified (2nd page):  Time of second page:  Responding MD:    Time MD responded:

## 2014-12-21 NOTE — Progress Notes (Addendum)
FMTS INTERIM PROGRESS NOTE  S: Called to evaluate patient after experiencing a 5 beat run of v-tach and transient left sided chest pain. Patient reported that the chest pain lasted only for a few moments and was sharp "like a knife." Is currently not experiencing any chest pain. Continues to have frequent cough. No more episodes of hemoptysis.  O: Blood pressure 100/53, pulse 91, temperature 97.8 F (36.6 C), temperature source Oral, resp. rate 20, height 5\' 10"  (1.778 m), weight 190 lb 7.6 oz (86.4 kg), SpO2 92 %. Gen: 74yo male in NAD lying in hospital bed CV: RRR, no murmurs Resp: Diffuse wheezes and ronchi throughout all lung fields  Tele: Currently NSR with few PVCs  A/P:  Chest Pain / V-tach. Likely related to patient's intractable coughing. No electrolyte abnormalities seen on BMP earlier today. Will rule out other causes of chest pain.  - Check EKG, CXR - Trend troponin - Check Mg, replete if <2  Cough. Has history of COPD. Oxygenating well on room air, though continues to have intractable cough with increased sputum production. Currently on duoneb q4hrs and prednisone burst.  - CXR as above - Start tessalon - Start doxycycline as patient's symptoms now more consistent with COPD exacerbation given increased sputum production.  Algis Greenhouse. Jerline Pain, [place] Resident PGY-2 12/21/2014 11:59 PM

## 2014-12-21 NOTE — Op Note (Signed)
[facility] [place], [id number]   ENDOSCOPY PROCEDURE REPORT  PATIENT: Austin, Stephenson  MR#: [id number] BIRTHDATE: 10/19/1940 , 73  yrs. old GENDER: male ENDOSCOPIST: Ladene Artist, MD, [facility] REFERRED BY:  Madison Hickman, M.D. PROCEDURE DATE:  12/21/2014 PROCEDURE:  EGD, diagnostic ASA CLASS:     Class III INDICATIONS:  hematemesis. MEDICATIONS: Fentanyl 50 mcg IV and Versed 5 mg IV TOPICAL ANESTHETIC: Cetacaine Spray DESCRIPTION OF PROCEDURE: After the risks benefits and alternatives of the procedure were thoroughly explained, informed consent was obtained.  The    endoscope was introduced through the mouth and advanced to the second portion of the duodenum , Without limitations.  The instrument was slowly withdrawn as the mucosa was fully examined.    ESOPHAGUS: Two non-bleeding, clean-based, shallow and round ulcers measuring 4 x 77mm in size were found in the mid esophagus. The esophagus otherwise appeared normal. STOMACH: Small, non bleeding Mallory Weiss tear in the cardia just distal to the EG junction. The mucosa of the stomach otherwise appeared normal. DUODENUM: The duodenal mucosa showed no abnormalities in the bulb and 2nd part of the duodenum.  Retroflexed views revealed a small hiatal hernia.   The scope was then withdrawn from the patient and the procedure completed.  COMPLICATIONS: There were no immediate complications.  ENDOSCOPIC IMPRESSION: 1.   Two ulcers in the mid esophagus-suspect pill induced ulcers 2.   Small Mallory Weiss tear in gastric cardia 3.   Small hiatal hernia  RECOMMENDATIONS: 1.  Anti-reflux regimen 2.  Continue PPI daily 3.  Pill ulcers could be from ASA or Zithromax. Discontinue chewable ASA. OK to use enteric coated ASA. Take all medications fully upright with at least 8 oz H20. 4.  Pill ulcers and MW tear will heal quickly 5.  OK for discharge from GI standpoint. No GI follow up  needed at this time. GI signing off.  eSigned:  Ladene Artist, MD, [facility] 12/21/2014 9:59 AM  [C

## 2014-12-21 NOTE — H&P (View-Only) (Signed)
Referring Provider: [facility] -- Chrisandra Netters, MD Primary Care Physician:  Pcp Not In System Primary Gastroenterologist: unassigned  Reason for Consultation:  Hematemesis      HPI: Austin Stephenson is a 74 y.o. male who relocated to [place] from [place], [place] years ago. He has a history of coronary artery disease status post CABG in 2014 at [place], COPD, diabetes type 2 on insulin, polycythemia, and bipolar disorder. He is status post a recent admission to [facility] for exacerbation of COPD and CHF. He was treated with steroids,, azithromycin, and DuoNeb. He has been on oral prednisone since discharge and uses aspirin periodically he was feeling fairly well but last night began to vomit blood. He says he felt "queasy" while but had no frank nausea. He has had a cough and when he saw streaks of blood he initially thought it was in his mucus and he came to the ER. Once in the ER he was witnessed vomiting bright red blood. He denies a prior history of ulcers, gastritis, or esophagitis. He says he had a colonoscopy a few  years ago in [place] while he was incarcerated. He is not sure of the findings. He denies dizziness, weakness, lightheadedness, blurred vision. He says his bowel movements have been normal and he has not noted any jet black stools nor has he had bright red blood per rectum. He admits to one or 2 beers per week, denies use of tobacco or intravenous drugs.   Past Medical History  Diagnosis Date  . COPD (chronic obstructive pulmonary disease)   . Hypertension   . Coronary artery disease   . Type II diabetes mellitus   . Myocardial infarction 2014  . CHF (congestive heart failure)   . Pneumonia "several times"  . Chronic bronchitis     "get it just about q yr" (11/26/2014)  . Bipolar disorder     Past Surgical History  Procedure Laterality Date  . Coronary angioplasty    . Tonsillectomy    . Inguinal hernia repair    . Coronary artery bypass  graft  2014    at [place]; "CABG X3"  . Colonoscopy      Prior to Admission medications   Medication Sig Start Date End Date Taking? Authorizing Provider  aspirin 81 MG chewable tablet Chew 1 tablet (81 mg total) by mouth daily. 11/28/14   Vivi Barrack, MD  azithromycin (ZITHROMAX) 250 MG tablet Take 1 tablet (250 mg total) by mouth daily. 11/28/14   Vivi Barrack, MD  guaiFENesin (MUCINEX) 600 MG 12 hr tablet Take 600 mg by mouth 2 (two) times daily as needed for cough.    Historical Provider, MD  predniSONE (DELTASONE) 50 MG tablet Take 1 tablet (50 mg total) by mouth daily with breakfast. 11/28/14   Vivi Barrack, MD    No current facility-administered medications for this encounter.   Current Outpatient Prescriptions  Medication Sig Dispense Refill  . aspirin 81 MG chewable tablet Chew 1 tablet (81 mg total) by mouth daily. 30 tablet 0  . azithromycin (ZITHROMAX) 250 MG tablet Take 1 tablet (250 mg total) by mouth daily. 2 each 0  . guaiFENesin (MUCINEX) 600 MG 12 hr tablet Take 600 mg by mouth 2 (two) times daily as needed for cough.    . predniSONE (DELTASONE) 50 MG tablet Take 1 tablet (50 mg total) by mouth daily with breakfast. 2 tablet 0    Allergies as of 12/20/2014  . (  No Known Allergies)    No family history on file.  History   Social History  . Marital Status: Divorced    Spouse Name: N/A  . Number of Children: N/A  . Years of Education: N/A   Occupational History  . Not on file.   Social History Main Topics  . Smoking status: Current Every Day Smoker -- 1.00 packs/day for 60 years    Types: Cigarettes  . Smokeless tobacco: Never Used  . Alcohol Use: No  . 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 Sexual Activity: No   Other Topics Concern  . Not on file   Social History Narrative    Review of Systems: Gen: Denies any fever, chills, sweats, anorexia,  fatigue, weakness, malaise, weight loss, and sleep disorder CV: Denies chest pain, angina, palpitations, syncope, orthopnea, PND, peripheral edema, and claudication. Resp: Has had a cough and has had some recent shortness of breath. GI: Denies jaundice, and fecal incontinence.   Denies dysphagia or odynophagia. Vomited blood this morning. GU : Denies urinary burning, blood in urine, urinary frequency, urinary hesitancy, nocturnal urination, and urinary incontinence. MS: Denies joint pain, limitation of movement, and swelling, stiffness, low back pain, extremity pain. Denies muscle weakness, cramps, atrophy.  Derm: Denies rash, itching, dry skin, hives, moles, warts, or unhealing ulcers.  Psych: Has a history of bipolar disorder Heme: Denies bruising, bleeding, and enlarged lymph nodes. Neuro:  Denies any headaches, dizziness, paresthesias. Endo:  Denies any problems with thyroid, adrenal function.  Physical Exam: Vital signs in last 24 hours: Temp:  [98.1 F (36.7 C)] 98.1 F (36.7 C) (07/30 0545) Pulse Rate:  [85-100] 90 (07/30 1130) Resp:  [17-35] 24 (07/30 1130) BP: (108-157)/(43-90) 118/52 mmHg (07/30 1130) SpO2:  [87 %-98 %] 93 % (07/30 1130) Weight:  [204 lb (92.534 kg)] 204 lb (92.534 kg) (07/30 0547)   General:   Alert,  Well-developed, well-nourished, pleasant and cooperative in NAD Head:  Normocephalic and atraumatic. Eyes:  Sclera clear, no icterus. Conjunctiva pink. Ears:  Normal auditory acuity. Nose:  No deformity, discharge,  or lesions. Mouth:  No deformity or lesions.   Neck:  Supple; no masses or thyromegaly. Lungs:  Clear throughout to auscultation.    Heart:  Regular rate and rhythm; no murmurs Abdomen:  Soft,nontender, BS active,nonpalp mass or hsm.   Rectal:  Deferred per patient. Msk:  Symmetrical without gross deformities. . Pulses:  Normal pulses noted. Extremities:  Without clubbing or edema. Neurologic:  Alert and  oriented x4;  grossly normal  neurologically. Skin: No rash noted. Psych: Alert and cooperative. Normal mood and affect.  Intake/Output from previous day:   Intake/Output this shift: Total I/O In: -  Out: 650 [Urine:650]  Lab Results:  Recent Labs  12/20/14 0555  WBC 14.2*  HGB 18.0*  HCT 53.4*  PLT 190   BMET  Recent Labs  12/20/14 0555 12/20/14 0740  NA 136  --   K 5.7* 6.1*  CL 104  --   CO2 25  --   GLUCOSE 259*  --   BUN 48*  --   CREATININE 1.40*  --   CALCIUM 8.5*  --    LFT  Recent Labs  12/20/14 0555  PROT 5.4*  ALBUMIN 3.1*  AST 15  ALT 21  ALKPHOS 59  BILITOT 1.2   PT/INR  Recent  Labs  12/20/14 1052  LABPROT 14.9  INR 1.15     Studies/Results: Ct Angio Chest Pe W/cm &/or Wo Cm  12/20/2014   CLINICAL DATA:  74 year old male with acute hemoptysis today.  EXAM: CT ANGIOGRAPHY CHEST WITH CONTRAST  TECHNIQUE: Multidetector CT imaging of the chest was performed using the standard protocol during bolus administration of intravenous contrast. Multiplanar CT image reconstructions and MIPs were obtained to evaluate the vascular anatomy.  CONTRAST:  22mL OMNIPAQUE IOHEXOL 350 MG/ML SOLN  COMPARISON:  12/20/2014 and 11/26/2014 chest radiographs  FINDINGS: This is a technically satisfactory study.  Mediastinum/Nodes: No pulmonary emboli are identified. Previous cardiac surgery/ CABG changes noted. Mild cardiomegaly is present. There is no evidence of thoracic aortic aneurysm. No pericardial effusion or enlarged lymph nodes identified.  Lungs/Pleura: Moderate centrilobular emphysema identified. There is no evidence of airspace disease, mass, nodule or consolidation. No endobronchial or endotracheal lesions are identified. Mild peribronchial thickening noted. Mild dependent/basilar atelectasis identified.  Upper abdomen: Unremarkable  Musculoskeletal: No acute or suspicious abnormalities.  Review of the MIP images confirms the above findings.  IMPRESSION: No evidence of pulmonary emboli or  thoracic aortic aneurysm.  Cardiomegaly and CABG changes.  Moderate emphysema/ COPD with mild dependent/basilar atelectasis.   Electronically Signed   By: Margarette Canada M.D.   On: 12/20/2014 09:58   Dg Chest Portable 1 View  12/20/2014   CLINICAL DATA:  Hemoptysis beginning last night, diaphoresis and fever with shortness of breath today. History of pneumonia, COPD, diabetes, CHF.  EXAM: PORTABLE CHEST - 1 VIEW  COMPARISON:  Chest radiograph November 26, 2014  FINDINGS: Similar cardiomegaly. Status post median sternotomy for CABG. Mild interstitial prominence in the mid and lower lung zone, improved from prior examination. No pleural effusion or focal consolidation. No pneumothorax. Mild degenerative change of the spine.  IMPRESSION: Stable cardiomegaly. Decreased interstitial prominence suggests resolving atypical infection without focal consolidation.   Electronically Signed   By: Elon Alas M.D.   On: 12/20/2014 06:43    IMPRESSION/PLAN: 74 year old male with a history of COPD, CAD, diabetes, hypertension, polycythemia, and bipolar disorder , who presented to ER with hematemesis. Patient currently hemodynamically stable. May have ice chips. PPI. IV fluids. Trend H&H. We will plan on endoscopy to evaluate for possible Mallory-Weiss tear, esophagitis, gastritis, ulcer, etc. tomorrow morning.The risks, benefits, and alternatives to endoscopy with possible biopsy and possible dilation were discussed with the patient and they consent to proceed.    Hvozdovic, Deloris Ping 12/20/2014,  Pager [phone]    Attending physician's note   I have taken an interval history, reviewed the chart and examined the patient. I agree with the Advanced Practitioner's note, impression and recommendations. Presented to ED for hemoptysis as pt noted cough with streaks of blood at home. During ED evaluation bloody emesis was witnessed by ED staff.  Pt notes nausea but no other GI symptoms. R/O MW tear, ulcer, esophagitis, etc.  EGD tomorrow. Primary service to determine if further evaluation of hemoptysis is needed. IV PPI, NPO except ice chips, trend Hb/Hct.  Pricilla Riffle. Fuller Plan, MD [facility] [phone] pager Mon-Fri 8a-5p [phone] weekends, holidays and 5p-8a or per [facility]

## 2014-12-21 NOTE — Progress Notes (Signed)
Family Medicine Teaching Service Daily Progress Note Intern Pager: [phone]  Patient name: Austin Stephenson Medical record number: [id number] Date of birth: 1941-03-11 Age: 74 y.o. Gender: male  Primary Care Provider: Pcp Not In System Consultants: GI Code Status: Full  Pt Overview and Major Events to Date:  7/30 - Admitted with hemoptysis   Assessment and Plan: Angus Amini is a 74 y.o. male presenting with hematemesis vs hemoptysis. PMH is significant for COPD, CHF, CAD s/p CABG at [place] in 2014, DM-2, hypertension, BPD and smoking.   Hemoptysis/Hematemesis: Hemodynamically stable. CTA negative for PE, aortic aneurysm, pneumonia, or other masses/lesions. Differential includes excessive coughing or bronchiectasis from known COPD. Must also consider GI source given observed hematemesis in ED. Elevated BUN suggestive of GI bleed.  -GI consulted, appreciate assistance -EGD today -Continue protonix BID -Monitor respiratory status -Trend CBCs -Hold ASA given concerns for bleed -Observe sputum for hemoptysis  -Consider Tessalon if patient's coughing doesn't improve  Elevated Creatinine. Cr 1.83 on the afternoon of admission. Unclear if new or old. During last hospitalization, Cr ranged from 1.46 to 1.79. - Monitor closely - Obtain records  COPD: Stable. Patient on albuterol inhaler and neb at home. Duoneb and prednisone given in ED.  -Continue Duoneb q6 PRN -Given poor control of respiratory symptoms, consider Spiriva or other daily mediation.  -Monitor respiratory status as above  Hyperkalemia, resolved: 6.1 on admission. Kayexalte given in ED, and it decreased to 5.5. No T wave abnormalities on EKG.  - Trend BMPs  HFpEF:  Last admission had an Echo with EF of 45-50% plus septal and inferior wall hypokinesis of LV (no prior Echo to compare).  -Monitor for symptoms of fluid overload -IVFs cautiously if needed.  -obtain [facility] if possible ([facility]): consider BB and/or ACEi  CAD:  s/p CABG (triple bypass) in 2014. Patient unsure of medications.  -ASA 81 mg daily (will hold for now, as noted above) - On weekday, consider contacting [facility] for medical records (was attempted at previous visit) - Will need a statin if not on one already  T2DM: Lantus 20 units plus 5 units short acting at home. No A1c in our EMR. -Begin Lantus to 10U daily - SSI -CBGs -pending A1c  Bipolar Disorder: In records but patient cannot remember which medication he is on  -Obtain medication history from pharmacy  FEN/GI: NPO with permissive ice chips Prophylaxis: SCDs, no pharmacologic intervention at this time secondary to bleeding  Disposition: Admitted to stepdown under attending Dr Ardelia Mems pending above work up.   Subjective:  Patient seen this morning after EGD procedure, still lethargic from sedation. No complaints voiced.   Objective: Temp:  [97.2 F (36.2 C)-99.6 F (37.6 C)] 97.2 F (36.2 C) (07/31 0325) Pulse Rate:  [87-100] 97 (07/31 0325) Resp:  [19-27] 20 (07/31 0325) BP: (108-141)/(43-61) 112/61 mmHg (07/31 0325) SpO2:  [90 %-97 %] 94 % (07/31 0325) Weight:  [190 lb 7.6 oz (86.4 kg)-192 lb 7.4 oz (87.3 kg)] 190 lb 7.6 oz (86.4 kg) (07/31 0325) Physical Exam: General: 74yo man in NAD lying in hospital bed sleeping.  Cardiovascular: RRR, no murmurs appreciated Respiratory: NWOB on room air, Diffuse wheezes heard throughout Abdomen: +BS, S, NT, ND Extremities: WWP, no cyanosis or edema  Laboratory/Imaging/Diagnostic Tests:  Recent Labs Lab 12/20/14 0555 12/21/14 0305  WBC 14.2* 23.2*  HGB 18.0* 16.5  HCT 53.4* 48.1  PLT 190 220    Recent Labs Lab 12/20/14 0555 12/20/14 0740 12/20/14 1536 12/21/14 0305  NA 136  --  133* 137  K 5.7* 6.1* 5.5* 4.2  CL 104  --  98* 103  CO2 25  --  27 24  BUN 48*  --  86* 99*  CREATININE 1.40*  --  1.83* 1.88*  CALCIUM 8.5*  --  8.3* 8.6*  PROT 5.4*  --   --   --   BILITOT 1.2  --   --   --   ALKPHOS 59  --   --   --    ALT 21  --   --   --   AST 15  --   --   --   GLUCOSE 259*  --  509* 259*     Austin Burnetta Sabin, MD 12/21/2014, 8:14 AM PGY-2, [place] Intern pager: [phone], text pages welcome

## 2014-12-21 NOTE — Progress Notes (Signed)
Pt had a 5 beats of Vtach on a screen , complaining of sharp chest pain. Dr Jerline Pain was paged and rec'd orders for stat 12 leads, troponin and  Magnesium. Will cont. To monitor pt.

## 2014-12-21 NOTE — Progress Notes (Signed)
Utilization Review Completed.Austin Stephenson T7/31/2016  

## 2014-12-22 ENCOUNTER — Inpatient Hospital Stay (HOSPITAL_COMMUNITY): Payer: Medicare Other

## 2014-12-22 ENCOUNTER — Encounter (HOSPITAL_COMMUNITY): Payer: Self-pay | Admitting: Gastroenterology

## 2014-12-22 LAB — BASIC METABOLIC PANEL
Anion gap: 8 (ref 5–15)
BUN: 81 mg/dL — ABNORMAL HIGH (ref 6–20)
CO2: 23 mmol/L (ref 22–32)
Calcium: 8.8 mg/dL — ABNORMAL LOW (ref 8.9–10.3)
Chloride: 104 mmol/L (ref 101–111)
Creatinine, Ser: 1.76 mg/dL — ABNORMAL HIGH (ref 0.61–1.24)
GFR calc Af Amer: 42 mL/min — ABNORMAL LOW (ref >60)
GFR calc non Af Amer: 36 mL/min — ABNORMAL LOW (ref >60)
Glucose, Bld: 366 mg/dL — ABNORMAL HIGH (ref 65–99)
POTASSIUM: 4.7 mmol/L (ref 3.5–5.1)
SODIUM: 135 mmol/L (ref 135–145)

## 2014-12-22 LAB — CBC
HCT: 43.3 % (ref 39.0–52.0)
Hemoglobin: 14.3 g/dL (ref 13.0–17.0)
MCH: 30.9 pg (ref 26.0–34.0)
MCHC: 33 g/dL (ref 30.0–36.0)
MCV: 93.5 fL (ref 78.0–100.0)
PLATELETS: 182 10*3/uL (ref 150–400)
RBC: 4.63 MIL/uL (ref 4.22–5.81)
RDW: 14.3 % (ref 11.5–15.5)
WBC: 12.2 10*3/uL — ABNORMAL HIGH (ref 4.0–10.5)

## 2014-12-22 LAB — GLUCOSE, CAPILLARY
Glucose-Capillary: 361 mg/dL — ABNORMAL HIGH (ref 65–99)
Glucose-Capillary: 247 mg/dL — ABNORMAL HIGH (ref 65–99)

## 2014-12-22 LAB — TROPONIN I
TROPONIN I: 0.05 ng/mL — AB (ref <0.031)
Troponin I: 0.05 ng/mL — ABNORMAL HIGH (ref <0.031)
Troponin I: 0.05 ng/mL — ABNORMAL HIGH (ref <0.031)

## 2014-12-22 LAB — HEMOGLOBIN A1C
HEMOGLOBIN A1C: 9.8 % — AB (ref 4.8–5.6)
MEAN PLASMA GLUCOSE: 235 mg/dL

## 2014-12-22 LAB — MAGNESIUM: Magnesium: 2 mg/dL (ref 1.7–2.4)

## 2014-12-22 MED ORDER — IPRATROPIUM-ALBUTEROL 0.5-2.5 (3) MG/3ML IN SOLN: 3.0000 mL | Freq: Four times a day (QID) | RESPIRATORY_TRACT | Status: AC | PRN

## 2014-12-22 MED ORDER — PANTOPRAZOLE SODIUM 40 MG PO TBEC: 40.0000 mg | DELAYED_RELEASE_TABLET | Freq: Every day | ORAL | Status: DC

## 2014-12-22 MED ORDER — INSULIN ASPART 100 UNIT/ML ~~LOC~~ SOLN: 5.0000 [IU] | Freq: Three times a day (TID) | SUBCUTANEOUS | Status: DC

## 2014-12-22 MED ORDER — INSULIN GLARGINE 100 UNIT/ML ~~LOC~~ SOLN: 20.0000 [IU] | Freq: Every day | SUBCUTANEOUS | Status: AC

## 2014-12-22 MED ORDER — SIMVASTATIN 80 MG PO TABS: 40.0000 mg | ORAL_TABLET | Freq: Every day | ORAL | Status: DC

## 2014-12-22 MED ORDER — QUETIAPINE FUMARATE 100 MG PO TABS: 200.0000 mg | ORAL_TABLET | Freq: Every day | ORAL | Status: AC

## 2014-12-22 MED ORDER — ASPIRIN 81 MG PO TBEC: 81.0000 mg | DELAYED_RELEASE_TABLET | Freq: Every day | ORAL | Status: AC

## 2014-12-22 MED ORDER — METOPROLOL TARTRATE 50 MG PO TABS: 25.0000 mg | ORAL_TABLET | Freq: Two times a day (BID) | ORAL | Status: DC

## 2014-12-22 MED ORDER — DOXYCYCLINE HYCLATE 100 MG PO TABS: 100.0000 mg | ORAL_TABLET | Freq: Two times a day (BID) | ORAL | Status: DC

## 2014-12-22 MED ORDER — BENZONATATE 200 MG PO CAPS: 200.0000 mg | ORAL_CAPSULE | Freq: Three times a day (TID) | ORAL | Status: DC

## 2014-12-22 MED ORDER — PREDNISONE 50 MG PO TABS: ORAL_TABLET | ORAL | Status: DC

## 2014-12-22 NOTE — Discharge Instructions (Signed)
Mr. Interrante was admitted for vomiting blood. Looking with a scope, the source of bleeding was found to be ulcerations in his esophagus (likely from pills getting stuck) and a small tear in his stomach lining (likely from vomiting). Taking pills one by one, while sitting up, and with a full 8 oz cup of water can help. Regular aspirin will be switched for coated pills. Continue protonix to help decrease stomach acid.   For trouble breathing and cough, continue 5 day course of doxycyline through 11/25/14. Continue prednisone for two more days, through 11/23/14. Continue home duonebs every 6 hours. Continue tessalon three times daily to help suppress cough. Drink water to help break up phlegm.   Please hold lisinopril, plavix, and lasix until follow-up.  Continue home metoprolol 25 mg twice daily for heart failure.  Continue home simvastatin 40 mg daily to reduce risk of stroke.   Resume home insulin regimen of 20 units of lantus nightly and 5 units novolog with each meal. Consider increase in dose at follow-up.   Seek emergency care if blood in vomit returns.   Peptic Ulcer A peptic ulcer is a sore in the lining of your esophagus (esophageal ulcer), stomach (gastric ulcer), or in the first part of your small intestine (duodenal ulcer). The ulcer causes erosion into the deeper tissue. CAUSES  Normally, the lining of the stomach and the small intestine protects itself from the acid that digests food. The protective lining can be damaged by:  An infection caused by a bacterium called Helicobacter pylori (H. pylori).  Regular use of nonsteroidal anti-inflammatory drugs (NSAIDs), such as ibuprofen or aspirin.  Smoking tobacco. Other risk factors include being older than 80, drinking alcohol excessively, and having a family history of ulcer disease.  SYMPTOMS   Burning pain or gnawing in the area between the chest and the belly button.  Heartburn.  Nausea and vomiting.  Bloating. The pain can be  worse on an empty stomach and at night. If the ulcer results in bleeding, it can cause:  Black, tarry stools.  Vomiting of bright red blood.  Vomiting of coffee-ground-looking materials. DIAGNOSIS  A diagnosis is usually made based upon your history and an exam. Other tests and procedures may be performed to find the cause of the ulcer. Finding a cause will help determine the best treatment. Tests and procedures may include:  Blood tests, stool tests, or breath tests to check for the bacterium H. pylori.  An upper gastrointestinal (GI) series of the esophagus, stomach, and small intestine.  An endoscopy to examine the esophagus, stomach, and small intestine.  A biopsy. TREATMENT  Treatment may include:  Eliminating the cause of the ulcer, such as smoking, NSAIDs, or alcohol.  Medicines to reduce the amount of acid in your digestive tract.  Antibiotic medicines if the ulcer is caused by the H. pylori bacterium.  An upper endoscopy to treat a bleeding ulcer.  Surgery if the bleeding is severe or if the ulcer created a hole somewhere in the digestive system. HOME CARE INSTRUCTIONS   Avoid tobacco, alcohol, and caffeine. Smoking can increase the acid in the stomach, and continued smoking will impair the healing of ulcers.  Avoid foods and drinks that seem to cause discomfort or aggravate your ulcer.  Only take medicines as directed by your caregiver. Do not substitute over-the-counter medicines for prescription medicines without talking to your caregiver.  Keep any follow-up appointments and tests as directed. SEEK MEDICAL CARE IF:   Your do not improve  within 7 days of starting treatment.  You have ongoing indigestion or heartburn. SEEK IMMEDIATE MEDICAL CARE IF:   You have sudden, sharp, or persistent abdominal pain.  You have bloody or dark black, tarry stools.  You vomit blood or vomit that looks like coffee grounds.  You become light-headed, weak, or feel  faint.  You become sweaty or clammy. MAKE SURE YOU:   Understand these instructions.  Will watch your condition.  Will get help right away if you are not doing well or get worse. Document Released: 05/06/2000 Document Revised: 09/23/2013 Document Reviewed: 12/07/2011 [facility] Patient Information 2015 [place], [place]. This information is not intended to replace advice given to you by your health care provider. Make sure you discuss any questions you have with your health care provider.

## 2014-12-22 NOTE — Progress Notes (Addendum)
Inpatient Diabetes Program Recommendations  AACE/ADA: New Consensus Statement on Inpatient Glycemic Control (2013)  Target Ranges:  Prepandial:   less than 140 mg/dL      Peak postprandial:   less than 180 mg/dL (1-2 hours)      Critically ill patients:  140 - 180 mg/dL   Results for Austin Stephenson, Austin Stephenson (MRN [id number]) as of 12/22/2014 10:00  Ref. Range 12/20/2014 20:39 12/20/2014 23:10 12/21/2014 16:15 12/21/2014 19:49 12/22/2014 07:27  Glucose-Capillary Latest Ref Range: 65-99 mg/dL 507 (H) 443 (H) 194 (H) 223 (H) 361 (H)   Reason for Admission: Hemoptysis  Diabetes history: DM 2 Outpatient Diabetes medications: Meds not listed on home med rec. MD reports 20 units of basal and 5 units short acting in H&P Current orders for Inpatient glycemic control: Lantus 10 units QHS, Novolog Sensitive + HS scale  Patient also on 50 mg PO prednisone  Inpatient Diabetes Program Recommendations Insulin - Basal: Glucose 361 mg/dl after 10 units of basal last pm. Please consider increasing basal insulin to 20 units QHS and give additional 10 units of basal this am. Correction (SSI): Please increase correction to Novolog Moderate scale TID. HgbA1C: Pending  Inpatient glucose control goal of <180 mg/dl.  Thanks,  Tama Headings RN, MSN, [facility] Inpatient Diabetes Coordinator Team Pager [phone]

## 2014-12-22 NOTE — Progress Notes (Signed)
Family Medicine Teaching Service Daily Progress Note Intern Pager: [phone]  Patient name: Austin Stephenson Medical record number: [id number] Date of birth: 1940/07/01 Age: 74 y.o. Gender: male  Primary Care Provider: Pcp Not In System Consultants: GI Code Status: Full  Pt Overview and Major Events to Date:  7/30 admitted with hemoptysis; EGD 7/31 positive for MW tear and pill ulcers  Assessment and Plan: Austin Stephenson is a 74 y.o. male with PMH of COPD, CHF, CAD s/p CABG in 2014, DM-2, HTN, BPD and current smoker who presented with hematemesis vs hemoptysis. Of note, he was recently admitted for respiratory failure 2/2 COPD.   Hemoptysis/Hematemesis: Hemodynamically stable. CTA negative for PE, aortic aneurysm, pneumonia, or other masses/lesions. Differential includes excessive coughing or bronchiectasis from known COPD. Evalulated by EGD 12/21/14 and found to have a Mallory Weiss tear and pill ulcers. Elevated BUN consistent with GI bleed.  -GI consulted and has signed off with recommendations.  -EGD 7/31 -Continue protonix BID -Monitor respiratory status -Trend CBCs --> Hbg WNL but down to 14.3 from 18.0 on admission; WBC 12.2, down from 23.2 yesterday -Switch to enteric coated ASA on discharge -Observe sputum for hemoptysis  -Continue Tessalon 200 mg PO TID for intractable cough  Chest pain: Likely MSK in origin given intractable cough.  -EKG negative for ST elevations.  Elevated Creatinine. Cr 1.83 on the afternoon of admission. Unclear if new or old. During last hospitalization, Cr ranged from 1.46 to 1.79. Currently 1.76.  - Monitor closely - Obtain records  COPD: Stable. Patient on albuterol inhaler and neb at home. Treating persistent cough as COPD exacerbation. -Prednisone 50 mg q breakfast  -Doxycycline 100 mg q12  -Continue Duoneb q6 PRN -Given poor control of respiratory symptoms, consider Spiriva or other daily mediation.  -Monitor respiratory status as  above -CXR with no acute changes.   Hyperkalemia, resolved: 6.1 on admission. Kayexalte given in ED, and it decreased to 5.5. No T wave abnormalities on EKG.  - Trend BMPs  HFpEF: Last admission had an Echo with EF of 45-50% plus septal and inferior wall hypokinesis of LV (no prior Echo to compare).  -Monitor for symptoms of fluid overload -IVFs cautiously if needed.  -[facility] possible ([facility] consider BB and/or ACEi  CAD: s/p CABG (triple bypass) in 2014. Patient unsure of medications.  - ASA 81 mg daily (will hold for now, as noted above) - [facility] (was attempted at previous visit) - Will need a statin if not on one already  T2DM: Lantus 20 units plus 5 units short acting at home. No A1c in our EMR. -Continue Lantus 10U daily, will likely discharge on home regimen of 20 U - SSI -CBGs -pending A1c  Bipolar Disorder: In records but patient cannot remember which medication he is on  -Obtain medication history from pharmacy  FEN/GI: Full diet, miralax 17 g PRN  Prophylaxis: SCDs, enteric coated ASA upon discharge  Disposition:  - Family medicine teaching service monitoring for continued hemoptysis and improvement in cough; will likely be stable for discharge home ([facility]) later this afternoon  Subjective:  Overnight, Austin Stephenson had a 5 beat run of vtab and sharp, left-sided chest pain that lasted only a few moments. He continued to have frequent cough but no hemoptysis. This morning, his cough has improved.   Objective: Temp:  [97.6 F (36.4 C)-98.9 F (37.2 C)] 97.9 F (36.6 C) (08/01 0326) Pulse Rate:  [87-103] 91 (08/01 0500) Resp:  [  15-28] 26 (08/01 0500) BP: (55-132)/(16-88) 112/49 mmHg (08/01 0500) SpO2:  [90 %-100 %] 92 % (08/01 0500) Weight:  [194 lb 14.2 oz (88.4 kg)] 194 lb 14.2 oz (88.4 kg) (08/01 0332)  Physical Exam: General: Well-nourished, well-appearing male in NAD Cardiovascular: Distant  heart sounds with regular rate and rhythm, no tenderness to palpation of chest wall  Respiratory: Expiratory wheezes throughout all lung fields, improves with cough  Abdomen: +BS, distended abdomen but soft, nontender Extremities: moves spontaneously, [facility]  Laboratory:  Recent Labs Lab 12/20/14 0555 12/21/14 0305 12/22/14 0105  WBC 14.2* 23.2* 12.2*  HGB 18.0* 16.5 14.3  HCT 53.4* 48.1 43.3  PLT 190 220 182    Recent Labs Lab 12/20/14 0555  12/20/14 1536 12/21/14 0305 12/22/14 0105  NA 136  --  133* 137 135  K 5.7*  < > 5.5* 4.2 4.7  CL 104  --  98* 103 104  CO2 25  --  27 24 23   BUN 48*  --  86* 99* 81*  CREATININE 1.40*  --  1.83* 1.88* 1.76*  CALCIUM 8.5*  --  8.3* 8.6* 8.8*  PROT 5.4*  --   --   --   --   BILITOT 1.2  --   --   --   --   ALKPHOS 59  --   --   --   --   ALT 21  --   --   --   --   AST 15  --   --   --   --   GLUCOSE 259*  --  509* 259* 366*  < > = values in this interval not displayed.  Troponins:  0.05 (0540), 0.05 (0105)  Imaging/Diagnostic Tests: CXR (12/22/14): No acute cardiopulmonary disease. Hyperinflation and mild cardiomegaly.  EKG (12/21/14): No ST elevation.  CXR (12/20/14): Decreased interstitial prominence compared to imagine 11/26/14.  CT Angio Chest (12/20/14): Negative for PE or thoracic aortic aneurysm   Rogue Bussing, MD 12/22/2014, 6:58 AM PGY-1, [place] Intern pager: [phone], text pages welcome

## 2014-12-22 NOTE — Care Management Note (Signed)
Case Management Note  Patient Details  Name: Austin Stephenson MRN: [id number] Date of Birth: [birth year]  Subjective/Objective:      Pt has been living @ [facility] but, per CSW, has now obtained voucher for housing through [employer].  Pt's PCP is Dr Romero Liner @ [facility].  CM called [facility] to report pt's hospitalization.              Action/Plan: Pt has been discharged.  CSW will provide taxi voucher if needed.  Expected Discharge Plan:  Home/Self Care  In-House Referral:  Clinical Social Work  Status of Service:  Completed, signed off   Austin Stephenson, Jagual T, [place] 12/22/2014, 3:07 PM

## 2014-12-22 NOTE — Progress Notes (Signed)
Patient given discharge instructions. Patient informed of follow-up appt. Patient educated on medication. Patient verbalized understanding.

## 2014-12-22 NOTE — Discharge Summary (Signed)
[facility] Discharge Summary  Patient name: Austin Stephenson Medical record number: [id number] Date of birth: November 01, 1940 Age: 74 y.o. Gender: male Date of Admission: 12/20/2014  Date of Discharge: 12/22/2014 Admitting Physician: Leeanne Rio, MD  Primary Care Provider: Pcp Not In System Consultants: GI   Indication for Hospitalization: hematemesis; SOB  Discharge Diagnoses/Problem List:  Austin Stephenson tear, mid-esophageal ulcers; COPD exacerbation  Disposition: Family Medicine  Discharge Condition: Improved  Discharge Exam:  General: Well-nourished, well-appearing male in NAD Cardiovascular: Distant heart sounds with regular rate and rhythm, no tenderness to palpation of chest wall  Respiratory: Expiratory wheezes throughout all lung fields, improves with cough  Abdomen: +BS, distended abdomen but soft, nontender Extremities: moves spontaneously, WWP; no edema Neuro: Grossly intact. No focalization.   Brief Hospital Course:  Austin Stephenson is a 55-y.o. male who presented with hematemesis after one-day history of hemoptysis with blood-streaked sputum. He had 1 episode at home and then another observed episode that was witnessed in the ED. He was also complaining of shortness of breath. PMH is significant for COPD, CHF, CAD s/p CABG at [place] in 2014, DM-2, hypertension, BPD and smoking.Of note, he was recently hospitalized on 7/6 for respiratory failure secondary to COPD.   In the ED, he was given prednisone and a Duoneb treatment for suspected COPD exacerbation. Chest x-ray was negative for acute cardiopulmonary disease but consistent with resolving atypical infection. CT angiogram chest was negative for PE. He was temporarily given 3L O2 by [place]. On admission, WBC was 14.2 after receiving steroids, and hemoglobin was 18.0. He was admitted to the [facility] Medicine Teaching Service for further observation.   While admitted, he remained hemodynamically  stable. EGD was performed 7/31 and revealed a small Mallory Weiss tear, likely due to vomiting, and two small esophageal ulcerations. GI recommended starting enteric-coated aspirin, using a PPI and taking pills sitting up with a full glass of water. No further intervention was deemed necessary.  For patient's respiratory symptoms, which included intractable coughing, prednisone and q6h duonebs were continued. Doxycycline and tessalon were started, as well.   [facility], patient's metoprolol, simvastatin and quetipine were reordered at discharge.  Hospital stay was complicated by hyperkalemia, which resolved with kayexalate, and chest pain that was likely costochondritis, given protracted coughing fits, EKG negative for ST elevation. Troponins remained at patient's baseline throughout admission. Repeat CXR was negative for any cardiopulmonary changes. In addition, patient had elevated creatinine to 1.88, which was decreased to 1.76 at time of discharge.    Issues for Follow Up:  Medication reconciliation:  -Held lisinopril due to AKI.  -Held clopidogrel given increased bleeding risk.  -Held lasix, as patient was euvolemic during hospitalization.  -Held glipizide, as was not given during hospitalization. CBGs ranged from 188 to 507 on regimen of Lantus 10 units daily with 5 units novolog with meals. Patient was discharge on Lantus 20 units with 5 units at meals.  -Given poor control of respiratory symptoms, consider adding Spiriva to daily medications.   Significant Procedures: EGD  Significant Labs and Imaging:   Recent Labs Lab 12/20/14 0555 12/21/14 0305 12/22/14 0105  WBC 14.2* 23.2* 12.2*  HGB 18.0* 16.5 14.3  HCT 53.4* 48.1 43.3  PLT 190 220 182    Recent Labs Lab 12/20/14 0555  12/20/14 1536 12/21/14 0305 12/22/14 0105  NA 136  --  133* 137 135  K 5.7*  < > 5.5* 4.2 4.7  CL 104  --  98*  103 104  CO2 25  --  27 24 23   GLUCOSE 259*  --  509* 259*  366*  BUN 48*  --  86* 99* 81*  CREATININE 1.40*  --  1.83* 1.88* 1.76*  CALCIUM 8.5*  --  8.3* 8.6* 8.8*  MG  --   --   --   --  2.0  ALKPHOS 59  --   --   --   --   AST 15  --   --   --   --   ALT 21  --   --   --   --   ALBUMIN 3.1*  --   --   --   --   < > = values in this interval not displayed.  Serial troponins 12/22/14: 0.5, 0.5, 0.5 Troponin 12/20/14: 0.07  Hbg A1c (12/20/14): 9.8  PT/INR: 14.9/1.15  Results/Tests Pending at Time of Discharge: None  Discharge Medications:    Medication List    STOP taking these medications        aspirin 81 MG chewable tablet  Replaced by:  aspirin 81 MG EC tablet     azithromycin 250 MG tablet  Commonly known as:  ZITHROMAX      TAKE these medications        aspirin 81 MG EC tablet  Take 1 tablet (81 mg total) by mouth daily after breakfast.     benzonatate 200 MG capsule  Commonly known as:  TESSALON  Take 1 capsule (200 mg total) by mouth 3 (three) times daily.     doxycycline 100 MG tablet  Commonly known as:  VIBRA-TABS  Take 1 tablet (100 mg total) by mouth every 12 (twelve) hours.     guaiFENesin 600 MG 12 hr tablet  Commonly known as:  MUCINEX  Take 600 mg by mouth 2 (two) times daily as needed for cough.     insulin aspart 100 UNIT/ML injection  Commonly known as:  novoLOG  Inject 5 Units into the skin 3 (three) times daily after meals.     insulin glargine 100 UNIT/ML injection  Commonly known as:  LANTUS  Inject 0.2 mLs (20 Units total) into the skin at bedtime.     ipratropium-albuterol 0.5-2.5 (3) MG/3ML Soln  Commonly known as:  DUONEB  Take 3 mLs by nebulization every 6 (six) hours as needed.     metoprolol 50 MG tablet  Commonly known as:  LOPRESSOR  Take 0.5 tablets (25 mg total) by mouth 2 (two) times daily.     pantoprazole 40 MG tablet  Commonly known as:  PROTONIX  Take 1 tablet (40 mg total) by mouth daily.     predniSONE 50 MG tablet  Commonly known as:  DELTASONE  Please take at  breakfast for two days.     QUEtiapine 100 MG tablet  Commonly known as:  SEROQUEL  Take 2 tablets (200 mg total) by mouth at bedtime.     simvastatin 80 MG tablet  Commonly known as:  ZOCOR  Take 0.5 tablets (40 mg total) by mouth daily.        Discharge Instructions: Please refer to Patient Instructions section of EMR for full details.  Patient was counseled important signs and symptoms that should prompt return to medical care, changes in medications, dietary instructions, activity restrictions, and follow up appointments. Counseled patient on importance of smoking cessation to reduce risk of bleeding recurrence.   Follow-Up Appointments: Follow-up Information    Follow up  with DYER,CHRISTOPHER, MD On 12/29/2014.   Specialty:  Unknown Physician Specialty   Why:  Hospital follow-up at [facility] location as previously scheduled.    Contact information:   [place] [phone]       Rogue Bussing, MD 12/22/2014, 5:58 PM PGY-1, [place]

## 2014-12-22 NOTE — Progress Notes (Signed)
CSW received consult for homelessness issues over the weekend.  Per notes pt has a Education officer, museum who has been working with him.  CSW called pt social worker who states pt has been staying at [facility] for homeless veterans for almost a year now and has just been approved for [employer] for housing.  No hospital CSW needs for housing- will provide taxi voucher if pt needs it at time of [place], [place] Social Worker [phone]

## 2014-12-31 ENCOUNTER — Other Ambulatory Visit: Payer: Self-pay | Admitting: Internal Medicine

## 2015-01-03 ENCOUNTER — Other Ambulatory Visit: Payer: Self-pay | Admitting: Internal Medicine

## 2015-06-12 ENCOUNTER — Other Ambulatory Visit: Payer: Self-pay | Admitting: Ophthalmology

## 2015-06-12 NOTE — H&P (Signed)
History & Physical:   DATE:   06-11-15  NAME:  Austin Stephenson, Austin Stephenson      [id number]       HISTORY OF PRESENT ILLNESS: referred by [facility] pATIENT HAS BEEN CLEARED FOR SURGERY BY [facility]NTERNIST AT [facility].     Chief Eye Complaints blurry vision diabetic eye exam   Patient c/o having problems with driving, reading and watching tv. Patient is not using any gtts at this time. Patient is just now getting over pneumonia. Patient does not have a cardiologist.   HPI: EYES: Reports symptoms of     LOCATION:   BOTH EYES        QUALITY/COURSE:   Reports condition is worsening.        INTENSITY/SEVERITY:    Reports measurement ( or degree) as mild.      DURATION:   Reports the general length of symptoms to be months.                 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  ICD9: 379.22    Diabetes, Type 2  ICD10: E11.9  ICD9: 250.00   Combined forms of age-related cataract, bilateral   ICD10: H25.813  ICD9:   Initial Date:  SURGERIES: 2014 Bypass Surgery    MEDICATIONS: Viagra (Sildenafil):   100 mg tablet  SIG-  1 tab(s)   once a day   Seroquel (Quetiapine):   100 mg tablet  SIG-  1 each   2 times a day   Multivitamin: Strength-  SIG-  Dose-  Freq-    Protonix (Pantoprazole):     40 mg delayed release tablet      SIG-   1 each       once a day                              Comment-       Xopenex (Levalbuterol) HFA Inhaler:     Strength-         SIG-    1 puff(s)       every 4 hours                        Glipizide (Glucotrol):   5 mg tablet  SIG-  1 each    2 times a day    Furosemide (Lasix):   20 mg tablet  SIG-  1 each    in the AM    Aspirin:  81 mg (tablet)  SIG-  1 each   once a day   Flomax (Tamsulosin):   0.4 mg capsule  SIG-  1 each   once a day   Lantus: Strength-  SIG-  Dose-  Freq-    NovoLOG Mix 70/30: Strength-  SIG-  Dose-  Freq-    Pred Forte: acetate 1% suspension  SIG-    Ciloxan (Ciprofloxacin) Solution: 0.3% solution SIG-  2 drop(s)  every 4 hours    Acular (Ketorlac Tromethamine):   0.5% solution  SIG-  2 drop(s)   4 times a day  REVIEW OF SYSTEMS: ROS:   GEN- Constitutional: HENT: GEN - Endocrine: Reports symptoms of LUNGS/Respiratory:  HEART/Cardiovascular: Reports symptoms of ABD/Gastrointestinal:  Musculoskeletal (BJE): NEURO/Neurological: PSYCH/Psychiatric:  Is the pt oriented to time, place, person? yes  Mood normal    TOBACCO: Current every day smoker   ICD10: Z72.0 ICD9: 305.1 Onset: 04/27/2015 10:19 Initial Date:     Current every day smoker   ICD10: Z72.0 ICD9: 305.1 Onset: 04/27/2015 10:19 Initial Date:    Tobacco use assessed.  Performed Date/Time: 04/27/2015 10:19 CPT#1000F Ordering Provider: Marylynn Pearson, Brooke Bonito Related Dxs-  Order Due:  Consultant:  Modifiers-   Tobacco cessation:          Smoker Status:   Current every day smoker   ICD10: Z72.0 ICD9: 305.1 Onset: 04/27/2015 09:15  SOCIAL HISTORY: Starter Pick List - Social History  FAMILY HISTORY: Family History - 1st Degree Relatives:  Son alive and well.  ALLERGIES: Drug Allergies.  No Known.   Starter - Allergies - Summary:  PHYSICAL EXAMINATION: VS: BMI: 37.9.  BP: 135/56.  H: 61.00 in.  P: 70 /min.  W: 200lbs 0oz.    [facility]  06/11/2015 11:58    OD cc 20/70 ph NI VERY BLURRY  OS cc 20/50+  EYEGLASSES:  OD -2.75 +0.75 x162  OS -0.25 +0.75 x027 ADD: +2.75  MR   04/27/2015 09:27  OD: -2.75 +0.75 x162 20/70 OS: -0.50 +0.75 x027 20/60 ADD +2.75  K's OD:  45.50  axis  002   45.50    axis 145 OS:  45.75  axis  004   45.75    axis 147  VF:   OD full in all four quadrants OS full in all four quadrants  Motility FULL  PUPILS: 84mm reactive -mg   EYELIDS & OCULAR ADNEXA normal    SLE: Conjunctiva  pinguecula    Cornea arcus    anterior chamber  :  deep and quiet    Iris brown   Lens 3+ burnescent  nuclear  sclerosis  OD, 2-3  nuclear  sclerosis   OS   Vitreous clear OD, asteroid hyalosis OS   CCT  Ta   in mmHg    OD 18           OS 19 Time 06/11/2015 12:17    Dilation   Fundus: difficult view   optic nerve  OD pink 25% cup                                   OS peripapillary atrophy 20% cup    Macula      OD flat                                                     OS flat   Vessels wnl   Periphery retinal drusen   Exam: GENERAL: Appearance: HEAD, EARS, NOSE AND THROAT: Ears-Nose (external) Inspection: Externally, nose and ears are normal in appearance and without scars, lesions, or nodules.      Hearing assessment shows no problems with normal conversation.      LUNGS and RESPIRATORY: Lung auscultation elicits . rhonci    Respiratory effort described as breathing is unlabored and chest movement is symmetrical.    HEART (Cardiovascular): Heart auscultation discovers regular rate and rhythm; no murmur, gallop or rub. Normal heart sounds.    ABDOMEN (Gastrointestinal): Mass/Tenderness Exam: Neither are present.     MUSCULOSKELETAL (BJE):  Inspection-Palpation: No major bone, joint, tendon, or muscle changes.      NEUROLOGICAL: Alert and oriented. No major deficits of coordination or sensation.      PSYCHIATRIC: Insight and judgment appear  both to be intact and appropriate.    Mood and affect are described as normal mood and full affect.    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  SURGICAL TREATMENT PLAN: THE DAY BEFORE SURGERY  start ciloxan tan top 2 times a day in the right eye  start ketorolac gray top 4 times a day in the right eye          phaco emulsion  cataract extraction IOL OD   Risk and benefits of surgery have been reviewed with the patient and the patient agrees to proceed with the surgical procedure.   patient has   medical clearance for surgery from Dr.Bajaj  ,     ___________________________ Marylynn Pearson, Brooke Bonito. Pneumococcal pneumonia  [Streptococcus, pneumoniae, pneumonia]  ICD10: J13  ICD9: 481  Onset: 04/27/2015 10:19  Initial Date:   Resolved: 04/27/2015  10:19   Starter - Inactive Problems:

## 2015-06-15 ENCOUNTER — Encounter (HOSPITAL_COMMUNITY): Payer: Self-pay

## 2015-06-15 ENCOUNTER — Other Ambulatory Visit: Payer: Self-pay

## 2015-06-15 ENCOUNTER — Encounter (HOSPITAL_COMMUNITY)
Admission: RE | Admit: 2015-06-15 | Discharge: 2015-06-15 | Disposition: A | Discharge status: Home / Self Care | Payer: No Typology Code available for payment source | Source: Ambulatory Visit | Attending: Ophthalmology | Admitting: Ophthalmology

## 2015-06-15 ENCOUNTER — Encounter (HOSPITAL_COMMUNITY): Payer: Self-pay | Admitting: Emergency Medicine

## 2015-06-15 DIAGNOSIS — I2510 Atherosclerotic heart disease of native coronary artery without angina pectoris: Secondary | ICD-10-CM | POA: Insufficient documentation

## 2015-06-15 DIAGNOSIS — I110 Hypertensive heart disease with heart failure: Secondary | ICD-10-CM | POA: Insufficient documentation

## 2015-06-15 DIAGNOSIS — H269 Unspecified cataract: Secondary | ICD-10-CM | POA: Diagnosis not present

## 2015-06-15 DIAGNOSIS — J449 Chronic obstructive pulmonary disease, unspecified: Secondary | ICD-10-CM | POA: Diagnosis not present

## 2015-06-15 DIAGNOSIS — Z794 Long term (current) use of insulin: Secondary | ICD-10-CM | POA: Insufficient documentation

## 2015-06-15 DIAGNOSIS — I252 Old myocardial infarction: Secondary | ICD-10-CM | POA: Diagnosis not present

## 2015-06-15 DIAGNOSIS — K219 Gastro-esophageal reflux disease without esophagitis: Secondary | ICD-10-CM | POA: Diagnosis not present

## 2015-06-15 DIAGNOSIS — Z01818 Encounter for other preprocedural examination: Secondary | ICD-10-CM | POA: Insufficient documentation

## 2015-06-15 DIAGNOSIS — Z01812 Encounter for preprocedural laboratory examination: Secondary | ICD-10-CM | POA: Insufficient documentation

## 2015-06-15 DIAGNOSIS — Z951 Presence of aortocoronary bypass graft: Secondary | ICD-10-CM | POA: Diagnosis not present

## 2015-06-15 DIAGNOSIS — E785 Hyperlipidemia, unspecified: Secondary | ICD-10-CM | POA: Insufficient documentation

## 2015-06-15 DIAGNOSIS — I509 Heart failure, unspecified: Secondary | ICD-10-CM | POA: Diagnosis not present

## 2015-06-15 DIAGNOSIS — F17200 Nicotine dependence, unspecified, uncomplicated: Secondary | ICD-10-CM | POA: Diagnosis not present

## 2015-06-15 DIAGNOSIS — Z7982 Long term (current) use of aspirin: Secondary | ICD-10-CM | POA: Insufficient documentation

## 2015-06-15 DIAGNOSIS — E119 Type 2 diabetes mellitus without complications: Secondary | ICD-10-CM | POA: Insufficient documentation

## 2015-06-15 DIAGNOSIS — Z79899 Other long term (current) drug therapy: Secondary | ICD-10-CM | POA: Diagnosis not present

## 2015-06-15 DIAGNOSIS — F319 Bipolar disorder, unspecified: Secondary | ICD-10-CM | POA: Insufficient documentation

## 2015-06-15 HISTORY — DX: Dorsalgia, unspecified: M54.9

## 2015-06-15 HISTORY — DX: Reserved for inherently not codable concepts without codable children: IMO0001

## 2015-06-15 HISTORY — DX: Cough, unspecified: R05.9

## 2015-06-15 HISTORY — DX: Emphysema (subcutaneous) resulting from a procedure, initial encounter: T81.82XA

## 2015-06-15 HISTORY — DX: Benign prostatic hyperplasia without lower urinary tract symptoms: N40.0

## 2015-06-15 HISTORY — DX: Unspecified osteoarthritis, unspecified site: M19.90

## 2015-06-15 HISTORY — DX: Hyperlipidemia, unspecified: E78.5

## 2015-06-15 HISTORY — DX: Dizziness and giddiness: R42

## 2015-06-15 HISTORY — DX: Personal history of colonic polyps: Z86.010

## 2015-06-15 HISTORY — DX: Gastro-esophageal reflux disease without esophagitis: K21.9

## 2015-06-15 HISTORY — DX: Personal history of Methicillin resistant Staphylococcus aureus infection: Z86.14

## 2015-06-15 HISTORY — DX: Edema, unspecified: R60.9

## 2015-06-15 HISTORY — DX: Nocturia: R35.1

## 2015-06-15 HISTORY — DX: Personal history of other infectious and parasitic diseases: Z86.19

## 2015-06-15 HISTORY — DX: Cough: R05

## 2015-06-15 LAB — BASIC METABOLIC PANEL
ANION GAP: 11 (ref 5–15)
BUN: 25 mg/dL — ABNORMAL HIGH (ref 6–20)
CALCIUM: 9.6 mg/dL (ref 8.9–10.3)
CO2: 28 mmol/L (ref 22–32)
Chloride: 104 mmol/L (ref 101–111)
Creatinine, Ser: 1.58 mg/dL — ABNORMAL HIGH (ref 0.61–1.24)
GFR, EST AFRICAN AMERICAN: 48 mL/min — AB (ref >60)
GFR, EST NON AFRICAN AMERICAN: 41 mL/min — AB (ref >60)
Glucose, Bld: 117 mg/dL — ABNORMAL HIGH (ref 65–99)
POTASSIUM: 4.5 mmol/L (ref 3.5–5.1)
SODIUM: 143 mmol/L (ref 135–145)

## 2015-06-15 LAB — CBC
HEMATOCRIT: 47.6 % (ref 39.0–52.0)
HEMOGLOBIN: 16.2 g/dL (ref 13.0–17.0)
MCH: 31.2 pg (ref 26.0–34.0)
MCHC: 34 g/dL (ref 30.0–36.0)
MCV: 91.5 fL (ref 78.0–100.0)
Platelets: 172 10*3/uL (ref 150–400)
RBC: 5.2 MIL/uL (ref 4.22–5.81)
RDW: 16 % — ABNORMAL HIGH (ref 11.5–15.5)
WBC: 12.2 10*3/uL — AB (ref 4.0–10.5)

## 2015-06-15 LAB — GLUCOSE, CAPILLARY: GLUCOSE-CAPILLARY: 99 mg/dL (ref 65–99)

## 2015-06-15 NOTE — Progress Notes (Addendum)
Anesthesia Consult:  Pt is a 75 year old male scheduled for R cataract extraction phaco and intraocular lens placement on 06/17/2015 with Dr. Venetia Maxon.   [facility] [place]   PMH includes:  CAD (MI, 3 vessel CABG 2014 at [place]), CHF, HTN, DM, hyperlipidemia, COPD, bipolar disorder, GERD. Current smoker. BMI 26.   Medication includes: ASA, symbicort, plavix, lasix, glipizide, novolog, lantus, duoneb, lisinopril, metoprolol, prednisone, seroquel, simvastatin  Preoperative labs reviewed.  Glucose 117. HgbA1c 8.8.   Chest x-ray report 06/11/15 reviewed.   1. Stable interstitial thickening suggestive of underlying interstitial lung disease.  2. Mild peribronchial thickening question secondary to bronchitis or viral infection. No evidence of lobar pneumonia  EKG 06/15/15: NSR. RBBB. LAFB.   PFTs 05/27/15, by [facility] notes: mild obstruction. No spirometric response to bronchodilator, but this does not preclude clinical benefit. No restriction. Gas transfer is moderately impaired; consider emphysema. Volumes normal.   Echo 11/27/14:  - Left ventricle: Septal and inferior wall hypokinesis The cavity size was mildly dilated. Wall thickness was increased in a pattern of mild LVH. Systolic function was mildly reduced. The estimated ejection fraction was in the range of 45% to 50%. - Aortic valve: There was mild regurgitation. - Left atrium: The atrium was mildly dilated.  Nuclear stress test 11/01/13 [facility]):  - Abnormal myocardial perfusion study - There was moderate in size, moderate in severity, nearly completely reversible defect involving the mid inferolateral, basal inferolateral and apical inferior segments. This is consistent with ischemia - Post stress: global systolic function is low normal. EF 49% - Attenuation CT scan shows post CABG findings  Saw pulmonologist Malachi Carl at [facility] on 06/04/15 for follow up. Dr. Cyndee Brightly cleared pt for surgery.  Dr. Cyndee Brightly notes "he does represent a higher risk  for cataract surgery. If he is clinically stable, he can proceed with surgery.   Contacted [facility] to see if there was a cardiac cath after abnormal stress test - there was not.   Reviewed case with Dr. Kalman Shan. Pt will need to see cardiology and have follow up for abnormal stress test prior to surgery. Notified Caryl Pina in Dr. Gertie Exon office.   Willeen Cass, FNP-BC [facility]/Anesthesiology Phone: [phone] 06/16/2015 3:32 PM

## 2015-06-15 NOTE — Pre-Procedure Instructions (Addendum)
Austin Stephenson  06/15/2015     No Pharmacies Listed   Your procedure is scheduled on Wed, Jan 25 @ 8:35 AM  Report to [facility] Admitting at 6:30 AM  Call this number if you have problems the morning of surgery:  [phone]   Remember:  Do not eat food or drink liquids after midnight.  Take these medicines the morning of surgery with A SIP OF WATER Tamsulosin(Flomax),Metoprolol(Lopressor),Neb treatment,and Bring Your Inhaler With You>             Stop taking your Aspirin. No Goody's,BC's,Aleve,Ibuprofen,Motrin,Advil,Fish Oil,or any Herbal Medications.                 How to Manage Your Diabetes Before Surgery   Why is it important to control my blood sugar before and after surgery?   Improving blood sugar levels before and after surgery helps healing and can limit problems.  A way of improving blood sugar control is eating a healthy diet by:  - Eating less sugar and carbohydrates  - Increasing activity/exercise  - Talk with your doctor about reaching your blood sugar goals  High blood sugars (greater than 180 mg/dL) can raise your risk of infections and slow down your recovery so you will need to focus on controlling your diabetes during the weeks before surgery.  Make sure that the doctor who takes care of your diabetes knows about your planned surgery including the date and location.  How do I manage my blood sugars before surgery?   Check your blood sugar at least 4 times a day, 2 days before surgery to make sure that they are not too high or low.   Check your blood sugar the morning of your surgery when you wake up and every 2               hours until you get to the Short-Stay unit.  If your blood sugar is less than 70 mg/dL, you will need to treat for low blood sugar by:  Treat a low blood sugar (less than 70 mg/dL) with 1/2 cup of clear juice (cranberry or apple), 4 glucose tablets, OR glucose gel.  Recheck blood sugar in 15 minutes after  treatment (to make sure it is greater than 70 mg/dL).  If blood sugar is not greater than 70 mg/dL on re-check, call [phone] for further instructions.   Report your blood sugar to the Short-Stay nurse when you get to Short-Stay.  References:  [facility], 2007 "How to Manage your Diabetes Before and After Surgery".  What do I do about my diabetes medications?   Do not take oral diabetes medicines (pills) the morning of surgery.  THE NIGHT BEFORE SURGERY, take 32 units of Lantus Insulin.     Do not take other diabetes injectables the day of surgery including Byetta, Victoza, Bydureon, and Trulicity.    If your CBG is greater than 220 mg/dL, you may take 1/2 of your sliding scale (correction) dose of insulin.   For patients with "Insulin Pumps":  Contact your diabetes doctor for specific instructions before surgery.   Decrease basal insulin rates by 20% at midnight the night before surgery.  Note that if your surgery is planned to be longer than 2 hours, your insulin pump will be removed and intravenous (IV) insulin will be started and managed by the nurses and anesthesiologist.  You will be able to restart your insulin pump once you are awake and  able to manage it.  Make sure to bring insulin pump supplies to the hospital with you in case your site needs to be changed.        Do not wear jewelry, make-up or nail polish.  Do not wear lotions, powders, or perfumes.  You may wear deodorant.  Do not shave 48 hours prior to surgery.  Men may shave face and neck.  Do not bring valuables to the hospital.  [facility] is not responsible for any belongings or valuables.  Contacts, dentures or bridgework may not be worn into surgery.  Leave your suitcase in the car.  After surgery it may be brought to your room.  For patients admitted to the hospital, discharge time will be determined by your treatment team.  Patients discharged the day of surgery  will not be allowed to drive home.    Special instructions:   - Preparing for Surgery  Before surgery, you can play an important role.  Because skin is not sterile, your skin needs to be as free of germs as possible.  You can reduce the number of germs on you skin by washing with CHG (chlorahexidine gluconate) soap before surgery.  CHG is an antiseptic cleaner which kills germs and bonds with the skin to continue killing germs even after washing.  Please DO NOT use if you have an allergy to CHG or antibacterial soaps.  If your skin becomes reddened/irritated stop using the CHG and inform your nurse when you arrive at Short Stay.  Do not shave (including legs and underarms) for at least 48 hours prior to the first CHG shower.  You may shave your face.  Please follow these instructions carefully:   1.  Shower with CHG Soap the night before surgery and the                                morning of Surgery.  2.  If you choose to wash your hair, wash your hair first as usual with your       normal shampoo.  3.  After you shampoo, rinse your hair and body thoroughly to remove the                      Shampoo.  4.  Use CHG as you would any other liquid soap.  You can apply chg directly       to the skin and wash gently with scrungie or a clean washcloth.  5.  Apply the CHG Soap to your body ONLY FROM THE NECK DOWN.        Do not use on open wounds or open sores.  Avoid contact with your eyes,       ears, mouth and genitals (private parts).  Wash genitals (private parts)       with your normal soap.  6.  Wash thoroughly, paying special attention to the area where your surgery        will be performed.  7.  Thoroughly rinse your body with warm water from the neck down.  8.  DO NOT shower/wash with your normal soap after using and rinsing off       the CHG Soap.  9.  Pat yourself dry with a clean towel.            10.  Wear clean pajamas.  11.  Place clean sheets on your bed the  night of your first shower and do not        sleep with pets.  Day of Surgery  Do not apply any lotions/deoderants the morning of surgery.  Please wear clean clothes to the hospital/surgery center.    Please read over the following fact sheets that you were given. Pain Booklet, Coughing and Deep Breathing and Anesthesia Post-op Instructions

## 2015-06-15 NOTE — Progress Notes (Addendum)
Cardiologist denies having one  [place] in [place].[facility] [place] and [place] if needed  Echo report in epic from 2016  Stress test done around 2014 and to be requested from [facility] cath denies  EKG in epic from 12-20-14  CXR in epic from 12-22-14  [facility] in [place] is Pulmonologist in [place]

## 2015-06-16 LAB — HEMOGLOBIN A1C
HEMOGLOBIN A1C: 8.8 % — AB (ref 4.8–5.6)
MEAN PLASMA GLUCOSE: 206 mg/dL

## 2015-06-16 MED ORDER — GATIFLOXACIN 0.5 % OP SOLN: 1.0000 [drp] | OPHTHALMIC | Status: DC

## 2015-06-16 MED ORDER — TROPICAMIDE 1 % OP SOLN: 1.0000 [drp] | OPHTHALMIC | Status: DC

## 2015-06-16 MED ORDER — PHENYLEPHRINE HCL 2.5 % OP SOLN: 1.0000 [drp] | OPHTHALMIC | Status: DC

## 2015-06-16 MED ORDER — CYCLOPENTOLATE HCL 1 % OP SOLN: 1.0000 [drp] | OPHTHALMIC | Status: DC

## 2015-06-16 MED ORDER — KETOROLAC TROMETHAMINE 0.5 % OP SOLN: 1.0000 [drp] | OPHTHALMIC | Status: DC

## 2015-06-16 MED ORDER — TETRACAINE HCL 0.5 % OP SOLN
1.0000 [drp] | OPHTHALMIC | Status: DC
  Filled 2015-06-16: qty 2

## 2015-06-17 ENCOUNTER — Encounter (HOSPITAL_COMMUNITY): Admission: RE | Payer: Self-pay | Source: Ambulatory Visit

## 2015-06-17 ENCOUNTER — Ambulatory Visit (HOSPITAL_COMMUNITY): Admission: RE | Admit: 2015-06-17 | Payer: Non-veteran care | Source: Ambulatory Visit | Admitting: Ophthalmology

## 2015-06-17 SURGERY — PHACOEMULSIFICATION, CATARACT, WITH IOL INSERTION
Anesthesia: Monitor Anesthesia Care | Site: Eye | Laterality: Right

## 2017-02-10 ENCOUNTER — Emergency Department (HOSPITAL_COMMUNITY): Payer: Medicare Other

## 2017-02-10 ENCOUNTER — Encounter (HOSPITAL_COMMUNITY): Payer: Self-pay | Admitting: *Deleted

## 2017-02-10 ENCOUNTER — Inpatient Hospital Stay (HOSPITAL_COMMUNITY)
Admission: EM | Admit: 2017-02-10 | Discharge: 2017-02-14 | DRG: 190 | Disposition: A | Discharge status: Home / Self Care | Payer: Medicare Other | Attending: Family Medicine | Admitting: Family Medicine

## 2017-02-10 DIAGNOSIS — E1140 Type 2 diabetes mellitus with diabetic neuropathy, unspecified: Secondary | ICD-10-CM | POA: Diagnosis present

## 2017-02-10 DIAGNOSIS — Z951 Presence of aortocoronary bypass graft: Secondary | ICD-10-CM

## 2017-02-10 DIAGNOSIS — Z7902 Long term (current) use of antithrombotics/antiplatelets: Secondary | ICD-10-CM

## 2017-02-10 DIAGNOSIS — S0181XA Laceration without foreign body of other part of head, initial encounter: Secondary | ICD-10-CM | POA: Diagnosis not present

## 2017-02-10 DIAGNOSIS — Z833 Family history of diabetes mellitus: Secondary | ICD-10-CM

## 2017-02-10 DIAGNOSIS — J441 Chronic obstructive pulmonary disease with (acute) exacerbation: Secondary | ICD-10-CM | POA: Diagnosis present

## 2017-02-10 DIAGNOSIS — Z8614 Personal history of Methicillin resistant Staphylococcus aureus infection: Secondary | ICD-10-CM

## 2017-02-10 DIAGNOSIS — I10 Essential (primary) hypertension: Secondary | ICD-10-CM | POA: Diagnosis not present

## 2017-02-10 DIAGNOSIS — Z7951 Long term (current) use of inhaled steroids: Secondary | ICD-10-CM

## 2017-02-10 DIAGNOSIS — E1165 Type 2 diabetes mellitus with hyperglycemia: Secondary | ICD-10-CM

## 2017-02-10 DIAGNOSIS — J9601 Acute respiratory failure with hypoxia: Secondary | ICD-10-CM | POA: Diagnosis not present

## 2017-02-10 DIAGNOSIS — I452 Bifascicular block: Secondary | ICD-10-CM | POA: Diagnosis present

## 2017-02-10 DIAGNOSIS — E1122 Type 2 diabetes mellitus with diabetic chronic kidney disease: Secondary | ICD-10-CM | POA: Diagnosis not present

## 2017-02-10 DIAGNOSIS — E785 Hyperlipidemia, unspecified: Secondary | ICD-10-CM | POA: Diagnosis present

## 2017-02-10 DIAGNOSIS — I2510 Atherosclerotic heart disease of native coronary artery without angina pectoris: Secondary | ICD-10-CM | POA: Diagnosis present

## 2017-02-10 DIAGNOSIS — Z79899 Other long term (current) drug therapy: Secondary | ICD-10-CM | POA: Diagnosis not present

## 2017-02-10 DIAGNOSIS — I252 Old myocardial infarction: Secondary | ICD-10-CM

## 2017-02-10 DIAGNOSIS — W19XXXA Unspecified fall, initial encounter: Secondary | ICD-10-CM | POA: Diagnosis not present

## 2017-02-10 DIAGNOSIS — R55 Syncope and collapse: Secondary | ICD-10-CM | POA: Diagnosis not present

## 2017-02-10 DIAGNOSIS — I4510 Unspecified right bundle-branch block: Secondary | ICD-10-CM | POA: Diagnosis present

## 2017-02-10 DIAGNOSIS — F319 Bipolar disorder, unspecified: Secondary | ICD-10-CM | POA: Diagnosis present

## 2017-02-10 DIAGNOSIS — Z23 Encounter for immunization: Secondary | ICD-10-CM | POA: Diagnosis present

## 2017-02-10 DIAGNOSIS — N183 Chronic kidney disease, stage 3 unspecified: Secondary | ICD-10-CM | POA: Diagnosis present

## 2017-02-10 DIAGNOSIS — I130 Hypertensive heart and chronic kidney disease with heart failure and stage 1 through stage 4 chronic kidney disease, or unspecified chronic kidney disease: Secondary | ICD-10-CM | POA: Diagnosis present

## 2017-02-10 DIAGNOSIS — K219 Gastro-esophageal reflux disease without esophagitis: Secondary | ICD-10-CM | POA: Diagnosis present

## 2017-02-10 DIAGNOSIS — Z794 Long term (current) use of insulin: Secondary | ICD-10-CM

## 2017-02-10 DIAGNOSIS — F17210 Nicotine dependence, cigarettes, uncomplicated: Secondary | ICD-10-CM | POA: Diagnosis present

## 2017-02-10 DIAGNOSIS — I5033 Acute on chronic diastolic (congestive) heart failure: Secondary | ICD-10-CM | POA: Diagnosis not present

## 2017-02-10 DIAGNOSIS — I351 Nonrheumatic aortic (valve) insufficiency: Secondary | ICD-10-CM | POA: Diagnosis not present

## 2017-02-10 DIAGNOSIS — E1129 Type 2 diabetes mellitus with other diabetic kidney complication: Secondary | ICD-10-CM | POA: Diagnosis present

## 2017-02-10 DIAGNOSIS — I2583 Coronary atherosclerosis due to lipid rich plaque: Secondary | ICD-10-CM

## 2017-02-10 DIAGNOSIS — S0182XA Laceration with foreign body of other part of head, initial encounter: Secondary | ICD-10-CM | POA: Diagnosis not present

## 2017-02-10 DIAGNOSIS — IMO0002 Reserved for concepts with insufficient information to code with codable children: Secondary | ICD-10-CM | POA: Diagnosis present

## 2017-02-10 DIAGNOSIS — T380X5A Adverse effect of glucocorticoids and synthetic analogues, initial encounter: Secondary | ICD-10-CM | POA: Diagnosis not present

## 2017-02-10 DIAGNOSIS — N400 Enlarged prostate without lower urinary tract symptoms: Secondary | ICD-10-CM | POA: Diagnosis present

## 2017-02-10 DIAGNOSIS — Z7982 Long term (current) use of aspirin: Secondary | ICD-10-CM | POA: Diagnosis not present

## 2017-02-10 LAB — BRAIN NATRIURETIC PEPTIDE: B NATRIURETIC PEPTIDE 5: 377.6 pg/mL — AB (ref 0.0–100.0)

## 2017-02-10 LAB — CBC WITH DIFFERENTIAL/PLATELET
BASOS ABS: 0 10*3/uL (ref 0.0–0.1)
Basophils Relative: 1 %
EOS ABS: 0.1 10*3/uL (ref 0.0–0.7)
EOS PCT: 2 %
HCT: 44.7 % (ref 39.0–52.0)
Hemoglobin: 14.8 g/dL (ref 13.0–17.0)
LYMPHS PCT: 13 %
Lymphs Abs: 1 10*3/uL (ref 0.7–4.0)
MCH: 31.5 pg (ref 26.0–34.0)
MCHC: 33.1 g/dL (ref 30.0–36.0)
MCV: 95.1 fL (ref 78.0–100.0)
Monocytes Absolute: 0.8 10*3/uL (ref 0.1–1.0)
Monocytes Relative: 10 %
Neutro Abs: 5.6 10*3/uL (ref 1.7–7.7)
Neutrophils Relative %: 74 %
PLATELETS: 161 10*3/uL (ref 150–400)
RBC: 4.7 MIL/uL (ref 4.22–5.81)
RDW: 13.2 % (ref 11.5–15.5)
WBC: 7.4 10*3/uL (ref 4.0–10.5)

## 2017-02-10 LAB — COMPREHENSIVE METABOLIC PANEL
ALT: 19 U/L (ref 17–63)
ANION GAP: 8 (ref 5–15)
AST: 22 U/L (ref 15–41)
Albumin: 3.6 g/dL (ref 3.5–5.0)
Alkaline Phosphatase: 109 U/L (ref 38–126)
BILIRUBIN TOTAL: 0.8 mg/dL (ref 0.3–1.2)
BUN: 18 mg/dL (ref 6–20)
CO2: 28 mmol/L (ref 22–32)
Calcium: 8.8 mg/dL — ABNORMAL LOW (ref 8.9–10.3)
Chloride: 106 mmol/L (ref 101–111)
Creatinine, Ser: 1.47 mg/dL — ABNORMAL HIGH (ref 0.61–1.24)
GFR calc Af Amer: 52 mL/min — ABNORMAL LOW (ref >60)
GFR, EST NON AFRICAN AMERICAN: 45 mL/min — AB (ref >60)
Glucose, Bld: 113 mg/dL — ABNORMAL HIGH (ref 65–99)
POTASSIUM: 3.7 mmol/L (ref 3.5–5.1)
Sodium: 142 mmol/L (ref 135–145)
TOTAL PROTEIN: 6.2 g/dL — AB (ref 6.5–8.1)

## 2017-02-10 LAB — I-STAT TROPONIN, ED: TROPONIN I, POC: 0.03 ng/mL (ref 0.00–0.08)

## 2017-02-10 LAB — GLUCOSE, CAPILLARY: GLUCOSE-CAPILLARY: 356 mg/dL — AB (ref 65–99)

## 2017-02-10 LAB — I-STAT CG4 LACTIC ACID, ED: LACTIC ACID, VENOUS: 1.68 mmol/L (ref 0.5–1.9)

## 2017-02-10 MED ORDER — IPRATROPIUM-ALBUTEROL 0.5-2.5 (3) MG/3ML IN SOLN
3.0000 mL | RESPIRATORY_TRACT | Status: DC | PRN
  Administered 2017-02-11 – 2017-02-12 (×4): 3 mL via RESPIRATORY_TRACT
  Filled 2017-02-10 (×3): qty 3

## 2017-02-10 MED ORDER — ONDANSETRON HCL 4 MG/2ML IJ SOLN: 4.0000 mg | Freq: Four times a day (QID) | INTRAMUSCULAR | Status: DC | PRN

## 2017-02-10 MED ORDER — GABAPENTIN 300 MG PO CAPS
300.0000 mg | ORAL_CAPSULE | Freq: Three times a day (TID) | ORAL | Status: DC
  Administered 2017-02-10 – 2017-02-14 (×11): 300 mg via ORAL
  Filled 2017-02-10 (×11): qty 1

## 2017-02-10 MED ORDER — FUROSEMIDE 10 MG/ML IJ SOLN
40.0000 mg | Freq: Two times a day (BID) | INTRAMUSCULAR | Status: DC
  Administered 2017-02-10 – 2017-02-11 (×2): 40 mg via INTRAVENOUS
  Filled 2017-02-10 (×2): qty 4

## 2017-02-10 MED ORDER — SODIUM CHLORIDE 0.9% FLUSH: 3.0000 mL | INTRAVENOUS | Status: DC | PRN

## 2017-02-10 MED ORDER — ONDANSETRON HCL 4 MG/2ML IJ SOLN
4.0000 mg | Freq: Once | INTRAMUSCULAR | Status: AC
Start: 2017-02-10 — End: 2017-02-10
  Administered 2017-02-10: 4 mg via INTRAVENOUS
  Filled 2017-02-10: qty 2

## 2017-02-10 MED ORDER — METOPROLOL SUCCINATE ER 50 MG PO TB24
50.0000 mg | ORAL_TABLET | Freq: Every day | ORAL | Status: DC
  Administered 2017-02-11 – 2017-02-14 (×4): 50 mg via ORAL
  Filled 2017-02-10 (×4): qty 1

## 2017-02-10 MED ORDER — ASPIRIN EC 81 MG PO TBEC
81.0000 mg | DELAYED_RELEASE_TABLET | Freq: Every day | ORAL | Status: DC
  Administered 2017-02-11 – 2017-02-14 (×4): 81 mg via ORAL
  Filled 2017-02-10 (×4): qty 1

## 2017-02-10 MED ORDER — INSULIN GLARGINE 100 UNIT/ML ~~LOC~~ SOLN: 30.0000 [IU] | Freq: Every day | SUBCUTANEOUS | Status: DC

## 2017-02-10 MED ORDER — INSULIN ASPART 100 UNIT/ML ~~LOC~~ SOLN
4.0000 [IU] | Freq: Three times a day (TID) | SUBCUTANEOUS | Status: DC
  Administered 2017-02-11 (×3): 4 [IU] via SUBCUTANEOUS

## 2017-02-10 MED ORDER — ASPIRIN 81 MG PO TBEC: 81.0000 mg | DELAYED_RELEASE_TABLET | Freq: Every day | ORAL | Status: DC

## 2017-02-10 MED ORDER — MOMETASONE FURO-FORMOTEROL FUM 200-5 MCG/ACT IN AERO
2.0000 | INHALATION_SPRAY | Freq: Two times a day (BID) | RESPIRATORY_TRACT | Status: DC
  Administered 2017-02-11: 2 via RESPIRATORY_TRACT
  Filled 2017-02-10: qty 8.8

## 2017-02-10 MED ORDER — ATORVASTATIN CALCIUM 40 MG PO TABS
40.0000 mg | ORAL_TABLET | Freq: Every day | ORAL | Status: DC
  Administered 2017-02-11 – 2017-02-13 (×3): 40 mg via ORAL
  Filled 2017-02-10 (×3): qty 1

## 2017-02-10 MED ORDER — SODIUM CHLORIDE 0.9 % IV SOLN: 250.0000 mL | INTRAVENOUS | Status: DC | PRN

## 2017-02-10 MED ORDER — ONDANSETRON HCL 4 MG PO TABS: 4.0000 mg | ORAL_TABLET | Freq: Four times a day (QID) | ORAL | Status: DC | PRN

## 2017-02-10 MED ORDER — NICOTINE 21 MG/24HR TD PT24
21.0000 mg | MEDICATED_PATCH | Freq: Once | TRANSDERMAL | Status: AC
  Administered 2017-02-10: 21 mg via TRANSDERMAL
  Filled 2017-02-10: qty 1

## 2017-02-10 MED ORDER — INSULIN GLARGINE 100 UNIT/ML ~~LOC~~ SOLN
35.0000 [IU] | Freq: Every day | SUBCUTANEOUS | Status: DC
  Administered 2017-02-10 – 2017-02-11 (×2): 35 [IU] via SUBCUTANEOUS
  Filled 2017-02-10 (×3): qty 0.35

## 2017-02-10 MED ORDER — HEPARIN SODIUM (PORCINE) 5000 UNIT/ML IJ SOLN
5000.0000 [IU] | Freq: Three times a day (TID) | INTRAMUSCULAR | Status: DC
  Administered 2017-02-10 – 2017-02-11 (×2): 5000 [IU] via SUBCUTANEOUS
  Filled 2017-02-10 (×2): qty 1

## 2017-02-10 MED ORDER — ZOLPIDEM TARTRATE 5 MG PO TABS: 5.0000 mg | ORAL_TABLET | Freq: Every evening | ORAL | Status: DC | PRN

## 2017-02-10 MED ORDER — ACETAMINOPHEN 325 MG PO TABS
650.0000 mg | ORAL_TABLET | ORAL | Status: DC | PRN
  Administered 2017-02-14: 650 mg via ORAL
  Filled 2017-02-10: qty 2

## 2017-02-10 MED ORDER — METHYLPREDNISOLONE SODIUM SUCC 40 MG IJ SOLR
40.0000 mg | Freq: Three times a day (TID) | INTRAMUSCULAR | Status: DC
  Administered 2017-02-11: 40 mg via INTRAVENOUS
  Filled 2017-02-10: qty 1

## 2017-02-10 MED ORDER — ALBUTEROL (5 MG/ML) CONTINUOUS INHALATION SOLN
10.0000 mg/h | INHALATION_SOLUTION | Freq: Once | RESPIRATORY_TRACT | Status: AC
  Administered 2017-02-10: 10 mg/h via RESPIRATORY_TRACT
  Filled 2017-02-10: qty 20

## 2017-02-10 MED ORDER — INSULIN ASPART 100 UNIT/ML ~~LOC~~ SOLN
0.0000 [IU] | Freq: Every day | SUBCUTANEOUS | Status: DC
  Administered 2017-02-10: 5 [IU] via SUBCUTANEOUS
  Administered 2017-02-11: 3 [IU] via SUBCUTANEOUS
  Administered 2017-02-12 – 2017-02-13 (×2): 5 [IU] via SUBCUTANEOUS

## 2017-02-10 MED ORDER — INSULIN ASPART 100 UNIT/ML ~~LOC~~ SOLN
0.0000 [IU] | Freq: Three times a day (TID) | SUBCUTANEOUS | Status: DC
  Administered 2017-02-11: 9 [IU] via SUBCUTANEOUS
  Administered 2017-02-11: 7 [IU] via SUBCUTANEOUS
  Administered 2017-02-11: 5 [IU] via SUBCUTANEOUS
  Administered 2017-02-12 (×2): 9 [IU] via SUBCUTANEOUS
  Administered 2017-02-12: 19 [IU] via SUBCUTANEOUS
  Administered 2017-02-13 (×3): 9 [IU] via SUBCUTANEOUS
  Administered 2017-02-14 (×2): 3 [IU] via SUBCUTANEOUS

## 2017-02-10 MED ORDER — MORPHINE SULFATE (PF) 4 MG/ML IV SOLN
4.0000 mg | Freq: Once | INTRAVENOUS | Status: AC
  Administered 2017-02-10: 4 mg via INTRAVENOUS
  Filled 2017-02-10: qty 1

## 2017-02-10 MED ORDER — LOSARTAN POTASSIUM 25 MG PO TABS
25.0000 mg | ORAL_TABLET | Freq: Every day | ORAL | Status: DC
  Administered 2017-02-11 – 2017-02-14 (×4): 25 mg via ORAL
  Filled 2017-02-10 (×4): qty 1

## 2017-02-10 MED ORDER — SODIUM CHLORIDE 0.9% FLUSH: 3.0000 mL | Freq: Two times a day (BID) | INTRAVENOUS | Status: DC

## 2017-02-10 MED ORDER — TAMSULOSIN HCL 0.4 MG PO CAPS
0.8000 mg | ORAL_CAPSULE | Freq: Every day | ORAL | Status: DC
  Administered 2017-02-11 – 2017-02-14 (×4): 0.8 mg via ORAL
  Filled 2017-02-10 (×4): qty 2

## 2017-02-10 MED ORDER — SODIUM CHLORIDE 0.9% FLUSH
3.0000 mL | Freq: Two times a day (BID) | INTRAVENOUS | Status: DC
  Administered 2017-02-10: 3 mL via INTRAVENOUS

## 2017-02-10 MED ORDER — TRAMADOL HCL 50 MG PO TABS
50.0000 mg | ORAL_TABLET | Freq: Three times a day (TID) | ORAL | Status: DC | PRN
  Administered 2017-02-13 – 2017-02-14 (×4): 50 mg via ORAL
  Filled 2017-02-10 (×4): qty 1

## 2017-02-10 MED ORDER — GABAPENTIN 300 MG PO CAPS: 300.0000 mg | ORAL_CAPSULE | Freq: Three times a day (TID) | ORAL | Status: DC

## 2017-02-10 MED ORDER — METHYLPREDNISOLONE SODIUM SUCC 125 MG IJ SOLR
125.0000 mg | Freq: Once | INTRAMUSCULAR | Status: AC
  Administered 2017-02-10: 125 mg via INTRAVENOUS
  Filled 2017-02-10: qty 2

## 2017-02-10 MED ORDER — ALPRAZOLAM 0.25 MG PO TABS
0.2500 mg | ORAL_TABLET | Freq: Two times a day (BID) | ORAL | Status: DC | PRN
  Administered 2017-02-11 – 2017-02-13 (×3): 0.25 mg via ORAL
  Filled 2017-02-10 (×3): qty 1

## 2017-02-10 NOTE — H&P (Signed)
History and Physical    Dara Camargo OEU:[id number] DOB: September 05, 1940 DOA: 02/10/2017  [facility]  [facility], [facility]CP is reported as Dr. Romero Liner in [place]   Patient coming from: Home  Chief Complaint: Cough, SOB   HPI: Yosgart Pavey is a 76 y.o. male with medical history significant for hypertension, type 2 diabetes mellitus, bipolar disorder, coronary artery disease status post CABG, tobacco abuse, chronic diastolic CHF, and chronic kidney disease stage III, now presenting to the emergency department with dyspnea and cough for the past 10 days. He was diagnosed with pneumonia early in the course of this illness and has completed antibiotics, but with no appreciable improvement. He actually feels like he is worsening today, now reporting dyspnea at rest. Patient reports running out of his Lasix approximately 1 month ago. Reports chronic orthopnea and chronic bilateral foot swelling, but has not noted any worsening in this. Denies fevers or chills. Also reports intermittent cramps involving the bilateral lower legs and left chest with no appreciable alleviating or exacerbating factors, localized to these areas, and resolving spontaneously over the course of a few minutes.  ED Course: Upon arrival to the ED, patient is found to require 4 L/m supplemental oxygen to maintain saturations in 90's, tachypneic and in acute respiratory distress, and with heart rate and blood pressure stable. EKG features a sinus rhythm with RBBB, LAFB, and inferior repolarization abnormality similar to prior. Chest x-ray is notable for new mild interstitial pulmonary edema and stable cardiomegaly. Chemistry panel reveals a creatinine of 1.47 which appears consistent with his baseline. CBC is unremarkable. Lactic acid is reassuring at 1.68 and troponin is within normal limits. Patient was treated with 125 mg IV Solu-Medrol, continuous albuterol neb, and demonstrated some improvement with these measures, but continues to be in  respiratory distress, suspected secondary to exacerbation and COPD and likely acute on chronic diastolic CHF. He will be admitted to the telemetry unit for ongoing evaluation and management of this.  Review of Systems:  All other systems reviewed and apart from HPI, are negative.  Past Medical History:  Diagnosis Date  . Arthritis   . Back pain   . Bipolar disorder ([place])   . CHF (congestive heart failure) (HCC)    takes Furosemide daily  . Chronic bronchitis ([place])    "get it just about q yr" (11/26/2014)  . COPD (chronic obstructive pulmonary disease) ([place])    Duoneb daily as needed.Inhaler daily  . Coronary artery disease    takes Plavix daily  . Cough    Tessalon daily and Mucinex daily as needed.Finishing up ZPAk today and Prednisone on 06/17/15  . Cough   . Dizziness   . Emphysema (subcutaneous) (surgical) resulting from a procedure   . Enlarged prostate    takes FLomax daily  . GERD (gastroesophageal reflux disease)    takes Protonix daily  . History of colon polyps    benign  . History of MRSA infection 2013  . History of shingles   . Hyperlipidemia    takes Simvastatin daily  . Hypertension    takes Metoprolol daily as well as Lisinopril  . Myocardial infarction ([place]) 2014  . Nocturia   . Peripheral edema   . Pneumonia    Nov 2016  . Shortness of breath dyspnea    with exertion  . Type II diabetes mellitus (HCC)    takes Novolog and Lantus daily.Average fasting blood sugar runs 120-130    Past Surgical History:  Procedure Laterality Date  . COLONOSCOPY    .  CORONARY ARTERY BYPASS GRAFT  2014   at [place]; "CABG X3"  . ESOPHAGOGASTRODUODENOSCOPY N/A 12/21/2014   Procedure: ESOPHAGOGASTRODUODENOSCOPY (EGD);  Surgeon: Ladene Artist, MD;  Location: [facility] ENDOSCOPY;  Service: Endoscopy;  Laterality: N/A;  . INGUINAL HERNIA REPAIR Left    with mesh  . KNEE SURGERY Right   . MANDIBLE FRACTURE SURGERY  70's  . TONSILLECTOMY       reports that he has been smoking  Cigarettes.  He has a 60.00 pack-year smoking history. He has never used smokeless tobacco. He reports that he uses drugs. He reports that he does not drink alcohol.  No Known Allergies  Family History  Problem Relation Age of Onset  . Diabetes Father      Prior to Admission medications   Medication Sig Start Date End Date Taking? Authorizing Provider  aspirin 81 MG EC tablet Take 1 tablet (81 mg total) by mouth daily after breakfast. Patient taking differently: Take 81 mg by mouth daily.  12/22/14  Yes Rogue Bussing, MD  furosemide (LASIX) 40 MG tablet Take 40 mg by mouth daily.   Yes [provider]  gabapentin (NEURONTIN) 300 MG capsule Take 300 mg by mouth 3 (three) times daily.   Yes [provider]  glipiZIDE (GLUCOTROL) 5 MG tablet Take 2.5 mg by mouth 2 (two) times daily before a meal.   Yes [provider]  glucose 4 GM chewable tablet Chew 1 tablet by mouth See admin instructions. Chew 1-4 tablet (4 -16 gm) by mouth<70 until blood sugar is increased and dizziness stops   Yes [provider]  losartan (COZAAR) 50 MG tablet Take 25 mg by mouth daily.   Yes [provider]  metoprolol succinate (TOPROL-XL) 100 MG 24 hr tablet Take 50 mg by mouth daily. Take with or immediately following a meal.   Yes [provider]  PRESCRIPTION MEDICATION Take 1 each by nebulization 4 (four) times daily as needed (shortness of breath/wheezing/cough).   Yes [provider]  PRESCRIPTION MEDICATION Inject 25 Units into the skin at bedtime. Long acting insulin   Yes [provider]  PRESCRIPTION MEDICATION Inject 15-20 Units into the skin See admin instructions. Short acting insulin - inject 15 units subcutaneously with breakfast & 20 units with lunch and supper   Yes [provider]  simvastatin (ZOCOR) 80 MG tablet Take 0.5 tablets (40 mg total) by mouth daily. Patient taking differently: Take 40 mg by mouth at  bedtime.  12/22/14  Yes Rogue Bussing, MD  tamsulosin (FLOMAX) 0.4 MG CAPS capsule Take 0.8 mg by mouth daily.    Yes [provider]  traMADol (ULTRAM) 50 MG tablet Take 50 mg by mouth every 8 (eight) hours as needed (pain).   Yes [provider]  budesonide-formoterol (SYMBICORT) 160-4.5 MCG/ACT inhaler Inhale 2 puffs into the lungs 2 (two) times daily.    [provider]  clopidogrel (PLAVIX) 75 MG tablet Take 75 mg by mouth daily.    [provider]  guaiFENesin (MUCINEX) 600 MG 12 hr tablet Take 600 mg by mouth 2 (two) times daily as needed for cough.    [provider]  insulin aspart (NOVOLOG) 100 UNIT/ML injection Inject 5 Units into the skin 3 (three) times daily after meals. Patient taking differently: Inject 10 Units into the skin 3 (three) times daily after meals.  12/22/14   Rogue Bussing, MD  insulin glargine (LANTUS) 100 UNIT/ML injection Inject 0.2 mLs (20 Units  total) into the skin at bedtime. Patient taking differently: Inject 40 Units into the skin at bedtime.  12/22/14   Rogue Bussing, MD  ipratropium-albuterol (DUONEB) 0.5-2.5 (3) MG/3ML SOLN Take 3 mLs by nebulization every 6 (six) hours as needed. 12/22/14   Rogue Bussing, MD  lisinopril (PRINIVIL,ZESTRIL) 40 MG tablet Take 20 mg by mouth daily.    [provider]  metoprolol tartrate (LOPRESSOR) 50 MG tablet Take 0.5 tablets (25 mg total) by mouth 2 (two) times daily. 12/22/14   Rogue Bussing, MD  prednisoLONE acetate (PRED FORTE) 1 % ophthalmic suspension Place 1 drop into the right eye 4 (four) times daily.    [provider]  predniSONE (DELTASONE) 5 MG tablet Take 5 mg by mouth daily with breakfast.    [provider]  QUEtiapine (SEROQUEL) 100 MG tablet Take 2 tablets (200 mg total) by mouth at bedtime. Patient taking differently: Take 50 mg by mouth at bedtime.  12/22/14   Rogue Bussing, MD     Physical Exam: Vitals:   02/10/17 1715 02/10/17 1730 02/10/17 1745 02/10/17 1800  BP: (!) 124/50 (!) 123/52 (!) 116/49 (!) 125/53  Pulse: 73 74 70 81  Resp: (!) 23 19 18  (!) 24  SpO2: 99% 99% 99% 98%      Constitutional: Appears chronically-ill and in respiratory distress with tachypnea and accessory muscle use. No pallor, no diaphoresis.  Eyes: PERTLA, lids and conjunctivae normal ENMT: Mucous membranes are moist. Posterior pharynx clear of any exudate or lesions.   Neck: normal, supple, no masses, no thyromegaly Respiratory: Inspiratory and expiratory wheezes. Increased WOB. No pallor or cyanosis. .  Cardiovascular: S1 & S2 heard, regular rate and rhythm. Trace bilateral pedal edema. No carotid bruits.  Abdomen: No distension, no tenderness, no masses palpated. Bowel sounds normal.  Musculoskeletal: no clubbing / cyanosis. No joint deformity upper and lower extremities.    Skin: no significant rashes, lesions, ulcers. Warm, dry, well-perfused. Neurologic: CN 2-12 grossly intact. Sensation intact. Strength 5/5 in all 4 limbs.  Psychiatric: Normal judgment and insight. Alert and oriented x 3. Normal mood and affect.     Labs on Admission: I have personally reviewed following labs and imaging studies  CBC:  Recent Labs Lab 02/10/17 1700  WBC 7.4  NEUTROABS 5.6  HGB 14.8  HCT 44.7  MCV 95.1  PLT 570   Basic Metabolic Panel:  Recent Labs Lab 02/10/17 1700  NA 142  K 3.7  CL 106  CO2 28  GLUCOSE 113*  BUN 18  CREATININE 1.47*  CALCIUM 8.8*   GFR: CrCl cannot be calculated (Unknown ideal weight.). Liver Function Tests:  Recent Labs Lab 02/10/17 1700  AST 22  ALT 19  ALKPHOS 109  BILITOT 0.8  PROT 6.2*  ALBUMIN 3.6   No results for input(s): LIPASE, AMYLASE in the last 168 hours. No results for input(s): AMMONIA in the last 168 hours. Coagulation Profile: No results for input(s): INR, PROTIME in the last 168 hours. Cardiac Enzymes: No results  for input(s): CKTOTAL, CKMB, CKMBINDEX, TROPONINI in the last 168 hours. BNP (last 3 results) No results for input(s): PROBNP in the last 8760 hours. HbA1C: No results for input(s): HGBA1C in the last 72 hours. CBG: No results for input(s): GLUCAP in the last 168 hours. Lipid Profile: No results for input(s): CHOL, HDL, LDLCALC, TRIG, CHOLHDL, LDLDIRECT in the last 72 hours. Thyroid Function Tests: No results for input(s): TSH, T4TOTAL, FREET4, T3FREE, THYROIDAB in the last  72 hours. Anemia Panel: No results for input(s): VITAMINB12, FOLATE, FERRITIN, TIBC, IRON, RETICCTPCT in the last 72 hours. Urine analysis: No results found for: COLORURINE, APPEARANCEUR, LABSPEC, PHURINE, GLUCOSEU, HGBUR, BILIRUBINUR, KETONESUR, PROTEINUR, UROBILINOGEN, NITRITE, LEUKOCYTESUR Sepsis Labs: @LABRCNTIP (procalcitonin:4,lacticidven:4) )No results found for this or any previous visit (from the past 240 hour(s)).   Radiological Exams on Admission: Dg Chest Portable 1 View  Result Date: 02/10/2017 CLINICAL DATA:  Shortness of breath for several days. Left chest pain. EXAM: PORTABLE CHEST 1 VIEW COMPARISON:  12/22/2014. FINDINGS: Stable enlarged cardiac silhouette and post CABG changes. Mild increase in prominence of the interstitial markings. No pleural fluid seen. Thoracic spine and right shoulder degenerative changes. IMPRESSION: 1. Interval mild interstitial pulmonary edema. 2. Stable cardiomegaly. Electronically Signed   By: Claudie Revering M.D.   On: 02/10/2017 17:14    EKG: Independently reviewed. Sinus rhythm, RBBB, LAFB, inferior repolarization abnormality. Similar to prior.   Assessment/Plan  1. COPD with acute exacerbation  - Pt presents with acute hypoxic respiratory failure, suspect there is contributions from both COPD exacerbation and acute on chronic CHF - There is significant wheezing on admission and recent increase in cough and sputum production  - Recently completed course of abx; no fever  or leukocytosis on admission and no conspicuous consolidation on CXR  - Treated in ED with 125 mg IV Solu-Medrol, continuous albuterol neb, and supplemental O2  - Plan to check sputum culture, continue systemic steroid, continue ICS/LABA, continue DuoNeb, prn supplemental O2   2. Acute on chronic diastolic CHF  - Pt presents with acute hypoxic respiratory failure, suspect there is contributions from both COPD exacerbation and acute on chronic CHF  - He has been out of Lasix for ~1 month; no recent weights available; no significant peripheral edema, but JVP elevated on admission and CXR suggests new pulmonary edema  - Plan to diurese with Lasix 40 mg IV q12h, continue cardiac monitoring, SLIV, fluid-restrict diet, follow daily wts and I/O's  - Continue ARB and beta-blocker, follow daily chem panel during diuresis  - Continue supportive care with supplemental O2 prn    3. Type II DM - A1c was 8.8% in January '17  - Managed at home with Lantus 40 units qHS and Novolog 10 units TID  - Check CBG with meals and qHS  - Continue Lantus at 35 units qHS, Novolog 4 units TID, and add a sliding-scale correctional while in hospital   4. Hypertension  - BP at goal  - Continue losartan and Toprol as tolerated    5. Bipolar disorder - Stable; denies hallucinations, SI, or HI on admission  - Previously managed with Seroquel, but no longer taking, will monitor   6. CKD stage III  - SCr is 1.47 on admission, better than priors and likely reflecting excess volume  - Plan to follow daily chem panel during diuresis   7. CAD  - Pt reports intermittent cramping pain in left chest over the past several days, possibly related to coughing, though has known CAD and is s/p CABG in 2014  - Initial troponin negative, and EKG not similar to prior; denies pain in ED  - Plan to continue with 2 more troponin measurements, continue cardiac monitoring, and follow-up echocardiogram    - Continue ARB, beta-blocker,  statin, and ASA 81    DVT prophylaxis: sq heparin Code Status: Full  Family Communication: Discussed with patient Disposition Plan: Admit to telemetry Consults called: None Admission status: Inpatient    Hyde Sires S Connery Shiffler,  MD [facility] Pager [phone]  If 7PM-7AM, please contact night-coverage [email or web address] Password [email or web address]  02/10/2017, 7:02 PM

## 2017-02-10 NOTE — ED Notes (Signed)
RN entered room to into room to introduce herself and update whiteboard; RN was immediately verbally attacked by patient stating disapproval of long wait for admission room, inpatient treatment plan, etc.; RN informed him that we are not kidnapping him, that he may freely leave at any point however it is not recommended by his care team at this time; RN explained delay to patient but pt proceeded to curse at RN and and state "well just let me go home then!" Opyd, MD informed of same and states he will come speak to the patient

## 2017-02-10 NOTE — ED Triage Notes (Signed)
Pt in c/o SOB onset x 10 worsening today, pt just completed antibiotics today for pneumonia, hx of CHF, pt rcvd 324 mg ASA, pt has cough with some L sided CP with cough pta, pt reported to have fall today, denies hitting head, denies LOC, A&O x4, pt on 4L [place] upon arrival to ED

## 2017-02-10 NOTE — ED Provider Notes (Signed)
[facility] DEPT Provider Note   CSN: [id number] Arrival date & time: 02/10/17  1638     History   Chief Complaint Chief Complaint  Patient presents with  . Shortness of Breath    HPI Austin Stephenson is a 76 y.o. male.  Pt presents to the ED today with sob.  The pt said he has recently completed a course of abx for pna.  The pt said that he has nebs which he took which have not been helping.  The pt denies f/c.  Pt brought here by EMS who gave him asa.  Pt does have CP, but it hurts when he coughs. Pt continues to smoke.      Past Medical History:  Diagnosis Date  . Arthritis   . Back pain   . Bipolar disorder ([place])   . CHF (congestive heart failure) (HCC)    takes Furosemide daily  . Chronic bronchitis ([place])    "get it just about q yr" (11/26/2014)  . COPD (chronic obstructive pulmonary disease) ([place])    Duoneb daily as needed.Inhaler daily  . Coronary artery disease    takes Plavix daily  . Cough    Tessalon daily and Mucinex daily as needed.Finishing up ZPAk today and Prednisone on 06/17/15  . Cough   . Dizziness   . Emphysema (subcutaneous) (surgical) resulting from a procedure   . Enlarged prostate    takes FLomax daily  . GERD (gastroesophageal reflux disease)    takes Protonix daily  . History of colon polyps    benign  . History of MRSA infection 2013  . History of shingles   . Hyperlipidemia    takes Simvastatin daily  . Hypertension    takes Metoprolol daily as well as Lisinopril  . Myocardial infarction ([place]) 2014  . Nocturia   . Peripheral edema   . Pneumonia    Nov 2016  . Shortness of breath dyspnea    with exertion  . Type II diabetes mellitus (HCC)    takes Novolog and Lantus daily.Average fasting blood sugar runs 120-130    Patient Active Problem List   Diagnosis Date Noted  . Bipolar disorder ([place]) 02/10/2017  . Esophageal ulcer   . DM (diabetes mellitus), type 2, uncontrolled, with renal complications ([place])   . CKD (chronic  kidney disease) stage 3, GFR 30-59 ml/min   . Hematemesis 12/20/2014  . Hemoptysis   . COPD mixed type ([place])   . Acute pulmonary edema (HCC)   . COPD exacerbation ([place])   . History of coronary artery bypass graft   . Essential hypertension   . Dyspnea 11/26/2014    Past Surgical History:  Procedure Laterality Date  . COLONOSCOPY    . CORONARY ARTERY BYPASS GRAFT  2014   at [facility]; "CABG X3"  . ESOPHAGOGASTRODUODENOSCOPY N/A 12/21/2014   Procedure: ESOPHAGOGASTRODUODENOSCOPY (EGD);  Surgeon: Ladene Artist, MD;  Location: [facility] ENDOSCOPY;  Service: Endoscopy;  Laterality: N/A;  . INGUINAL HERNIA REPAIR Left    with mesh  . KNEE SURGERY Right   . MANDIBLE FRACTURE SURGERY  70's  . TONSILLECTOMY         Home Medications    Prior to Admission medications   Medication Sig Start Date End Date Taking? Authorizing Provider  aspirin 81 MG EC tablet Take 1 tablet (81 mg total) by mouth daily after breakfast. 12/22/14   Rogue Bussing, MD  budesonide-formoterol [facility]) 160-4.5 MCG/ACT inhaler Inhale 2 puffs into the lungs 2 (two) times daily.  [provider]  ciprofloxacin (CILOXAN) 0.3 % ophthalmic solution Place 1 drop into the right eye 2 (two) times daily. Administer 1 drop, every 2 hours, while awake, for 2 days. Then 1 drop, every 4 hours, while awake, for the next 5 days.    [provider]  clopidogrel (PLAVIX) 75 MG tablet Take 75 mg by mouth daily.    [provider]  furosemide (LASIX) 40 MG tablet Take 40 mg by mouth daily.    [provider]  glipiZIDE (GLUCOTROL) 5 MG tablet Take 2.5 mg by mouth 2 (two) times daily before a meal.    [provider]  guaiFENesin (MUCINEX) 600 MG 12 hr tablet Take 600 mg by mouth 2 (two) times daily as needed for cough.    [provider]  insulin aspart (NOVOLOG) 100 UNIT/ML injection Inject 5 Units into the skin 3 (three) times daily after meals. Patient taking differently:  Inject 10 Units into the skin 3 (three) times daily after meals.  12/22/14   Rogue Bussing, MD  insulin glargine (LANTUS) 100 UNIT/ML injection Inject 0.2 mLs (20 Units total) into the skin at bedtime. Patient taking differently: Inject 40 Units into the skin at bedtime.  12/22/14   Rogue Bussing, MD  ipratropium-albuterol (DUONEB) 0.5-2.5 (3) MG/3ML SOLN Take 3 mLs by nebulization every 6 (six) hours as needed. 12/22/14   Rogue Bussing, MD  ketorolac (ACULAR) 0.4 % SOLN Place 1 drop into the right eye 4 (four) times daily.    [provider]  lisinopril (PRINIVIL,ZESTRIL) 40 MG tablet Take 20 mg by mouth daily.    [provider]  metoprolol tartrate (LOPRESSOR) 50 MG tablet Take 0.5 tablets (25 mg total) by mouth 2 (two) times daily. 12/22/14   Rogue Bussing, MD  prednisoLONE acetate (PRED FORTE) 1 % ophthalmic suspension Place 1 drop into the right eye 4 (four) times daily.    [provider]  predniSONE (DELTASONE) 5 MG tablet Take 5 mg by mouth daily with breakfast.    [provider]  QUEtiapine (SEROQUEL) 100 MG tablet Take 2 tablets (200 mg total) by mouth at bedtime. Patient taking differently: Take 50 mg by mouth at bedtime.  12/22/14   Rogue Bussing, MD  simvastatin (ZOCOR) 80 MG tablet Take 0.5 tablets (40 mg total) by mouth daily. 12/22/14   Rogue Bussing, MD  tamsulosin (FLOMAX) 0.4 MG CAPS capsule Take 0.4 mg by mouth daily.    [provider]    Family History No family history on file.  Social History Social History  Substance Use Topics  . Smoking status: Current Every Day Smoker    Packs/day: 1.00    Years: 60.00    Types: Cigarettes  . Smokeless tobacco: Never Used  . Alcohol use No     Comment: quit 15 yrs ago     Allergies   Patient has no known allergies.   Review of Systems Review of Systems  Respiratory: Positive for cough and shortness of breath.   All  other systems reviewed and are negative.    Physical Exam Updated Vital Signs BP (!) 125/53   Pulse 81   Resp (!) 24   SpO2 98%   Physical Exam  Constitutional: He is oriented to person, place, and time. He appears well-developed. He appears distressed.  HENT:  Head: Normocephalic and atraumatic.  Right Ear: External ear normal.  Left Ear: External ear normal.  Nose: Nose normal.  Mouth/Throat: Oropharynx is clear and moist.  Eyes: Pupils are equal, round, and reactive to light. Conjunctivae and EOM are normal.  Neck: Normal range of motion. Neck supple.  Cardiovascular: Normal rate, regular rhythm, normal heart sounds and intact distal pulses.   Pulmonary/Chest: He is in respiratory distress. He has wheezes.  Abdominal: Soft. Bowel sounds are normal.  Musculoskeletal: Normal range of motion.  Neurological: He is alert and oriented to person, place, and time.  Skin: Skin is warm.  Psychiatric: He has a normal mood and affect. His behavior is normal. Judgment and thought content normal.  Nursing note and vitals reviewed.    ED Treatments / Results  Labs (all labs ordered are listed, but only abnormal results are displayed) Labs Reviewed  COMPREHENSIVE METABOLIC PANEL - Abnormal; Notable for the following:       Result Value   Glucose, Bld 113 (*)    Creatinine, Ser 1.47 (*)    Calcium 8.8 (*)    Total Protein 6.2 (*)    GFR calc non Af Amer 45 (*)    GFR calc Af Amer 52 (*)    All other components within normal limits  CBC WITH DIFFERENTIAL/PLATELET  URINALYSIS, ROUTINE W REFLEX MICROSCOPIC  BRAIN NATRIURETIC PEPTIDE  I-STAT CG4 LACTIC ACID, ED  I-STAT TROPONIN, ED    EKG  EKG Interpretation  Date/Time:  Friday February 10 2017 16:46:31 EDT Ventricular Rate:  81 PR Interval:    QRS Duration: 132 QT Interval:  440 QTC Calculation: 511 R Axis:   -62 Text Interpretation:  Sinus rhythm RBBB and LAFB ST elevation, consider inferior injury No significant change  since last tracing Confirmed by Isla Pence [phone]) on 02/10/2017 6:38:29 PM       Radiology Dg Chest Portable 1 View  Result Date: 02/10/2017 CLINICAL DATA:  Shortness of breath for several days. Left chest pain. EXAM: PORTABLE CHEST 1 VIEW COMPARISON:  12/22/2014. FINDINGS: Stable enlarged cardiac silhouette and post CABG changes. Mild increase in prominence of the interstitial markings. No pleural fluid seen. Thoracic spine and right shoulder degenerative changes. IMPRESSION: 1. Interval mild interstitial pulmonary edema. 2. Stable cardiomegaly. Electronically Signed   By: Claudie Revering M.D.   On: 02/10/2017 17:14    Procedures Procedures (including critical care time)  Medications Ordered in ED Medications  nicotine (NICODERM CQ - dosed in mg/24 hours) patch 21 mg (21 mg Transdermal Patch Applied 02/10/17 1835)  albuterol (PROVENTIL,VENTOLIN) solution continuous neb (10 mg/hr Nebulization Given 02/10/17 1650)  methylPREDNISolone sodium succinate (SOLU-MEDROL) 125 mg/2 mL injection 125 mg (125 mg Intravenous Given 02/10/17 1652)  ondansetron (ZOFRAN) injection 4 mg (4 mg Intravenous Given 02/10/17 1834)  morphine 4 MG/ML injection 4 mg (4 mg Intravenous Given 02/10/17 1835)     Initial Impression / Assessment and Plan / ED Course  I have reviewed the triage vital signs and the nursing notes.  Pertinent labs & imaging results that were available during my care of the patient were reviewed by me and considered in my medical decision making (see chart for details).   Pt breathing much better after continuous neb and solumedrol.  However, he is still very wheezy and dyspneic. Pt d/w Dr. Myna Hidalgo ([facility]) for admission. Final Clinical Impressions(s) / ED Diagnoses   Final diagnoses:  COPD exacerbation ([place])    New Prescriptions New Prescriptions   No medications on file     Isla Pence, MD 02/10/17 [phone]

## 2017-02-11 ENCOUNTER — Inpatient Hospital Stay (HOSPITAL_COMMUNITY): Payer: Medicare Other

## 2017-02-11 DIAGNOSIS — I351 Nonrheumatic aortic (valve) insufficiency: Secondary | ICD-10-CM

## 2017-02-11 LAB — GLUCOSE, CAPILLARY
GLUCOSE-CAPILLARY: 305 mg/dL — AB (ref 65–99)
Glucose-Capillary: 366 mg/dL — ABNORMAL HIGH (ref 65–99)
Glucose-Capillary: 300 mg/dL — ABNORMAL HIGH (ref 65–99)
Glucose-Capillary: 289 mg/dL — ABNORMAL HIGH (ref 65–99)

## 2017-02-11 LAB — URINALYSIS, ROUTINE W REFLEX MICROSCOPIC
BACTERIA UA: NONE SEEN
BILIRUBIN URINE: NEGATIVE
Glucose, UA: >=500 mg/dL — AB
KETONES UR: NEGATIVE mg/dL
LEUKOCYTES UA: NEGATIVE
Nitrite: NEGATIVE
PROTEIN: NEGATIVE mg/dL
RBC / HPF: NONE SEEN RBC/hpf (ref 0–5)
Specific Gravity, Urine: 1.012 (ref 1.005–1.030)
pH: 5 (ref 5.0–8.0)

## 2017-02-11 LAB — CBC
HCT: 47.5 % (ref 39.0–52.0)
Hemoglobin: 15.9 g/dL (ref 13.0–17.0)
MCH: 32.1 pg (ref 26.0–34.0)
MCHC: 33.5 g/dL (ref 30.0–36.0)
MCV: 96 fL (ref 78.0–100.0)
PLATELETS: 154 10*3/uL (ref 150–400)
RBC: 4.95 MIL/uL (ref 4.22–5.81)
RDW: 12.9 % (ref 11.5–15.5)
WBC: 11 10*3/uL — AB (ref 4.0–10.5)

## 2017-02-11 LAB — BASIC METABOLIC PANEL
ANION GAP: 12 (ref 5–15)
BUN: 25 mg/dL — ABNORMAL HIGH (ref 6–20)
CALCIUM: 9 mg/dL (ref 8.9–10.3)
CHLORIDE: 95 mmol/L — AB (ref 101–111)
CO2: 28 mmol/L (ref 22–32)
Creatinine, Ser: 1.82 mg/dL — ABNORMAL HIGH (ref 0.61–1.24)
GFR calc Af Amer: 40 mL/min — ABNORMAL LOW (ref >60)
GFR calc non Af Amer: 34 mL/min — ABNORMAL LOW (ref >60)
GLUCOSE: 399 mg/dL — AB (ref 65–99)
POTASSIUM: 4.6 mmol/L (ref 3.5–5.1)
Sodium: 135 mmol/L (ref 135–145)

## 2017-02-11 LAB — TROPONIN I
Troponin I: 0.04 ng/mL (ref <0.03)
Troponin I: 0.03 ng/mL (ref <0.03)

## 2017-02-11 LAB — ECHOCARDIOGRAM COMPLETE
HEIGHTINCHES: 70 in
WEIGHTICAEL: 3312 [oz_av]

## 2017-02-11 MED ORDER — IPRATROPIUM-ALBUTEROL 0.5-2.5 (3) MG/3ML IN SOLN
3.0000 mL | RESPIRATORY_TRACT | Status: DC
  Filled 2017-02-11: qty 3

## 2017-02-11 MED ORDER — GI COCKTAIL ~~LOC~~
30.0000 mL | Freq: Once | ORAL | Status: AC
  Administered 2017-02-11: 30 mL via ORAL
  Filled 2017-02-11: qty 30

## 2017-02-11 MED ORDER — METHYLPREDNISOLONE SODIUM SUCC 40 MG IJ SOLR
40.0000 mg | Freq: Two times a day (BID) | INTRAMUSCULAR | Status: DC
  Administered 2017-02-11 – 2017-02-13 (×4): 40 mg via INTRAVENOUS
  Filled 2017-02-11 (×4): qty 1

## 2017-02-11 MED ORDER — INFLUENZA VAC SPLIT HIGH-DOSE 0.5 ML IM SUSY
0.5000 mL | PREFILLED_SYRINGE | INTRAMUSCULAR | Status: AC
  Administered 2017-02-13: 0.5 mL via INTRAMUSCULAR
  Filled 2017-02-11: qty 0.5

## 2017-02-11 MED ORDER — FUROSEMIDE 10 MG/ML IJ SOLN
40.0000 mg | Freq: Every day | INTRAMUSCULAR | Status: DC
  Administered 2017-02-12 – 2017-02-14 (×3): 40 mg via INTRAVENOUS
  Filled 2017-02-11 (×3): qty 4

## 2017-02-11 NOTE — Progress Notes (Signed)
  Echocardiogram 2D Echocardiogram has been performed.  Lei Dower 02/11/2017, 1:48 PM

## 2017-02-11 NOTE — Progress Notes (Signed)
Patient refusing bed alarm becomes very agitated and irritable when alarm sounds.

## 2017-02-11 NOTE — Progress Notes (Signed)
MD on call paged for positive troponin. Waiting for call back/response. Pt denies any chest pain or discomfort at this time. Will continue to monitor

## 2017-02-11 NOTE — Progress Notes (Signed)
Pt continuing to jump out of bed, rip of nasal cannula and attempt to ambulate to the restroom.   Pt is adamantly refusing to sit at bedside to use urinal.   Pt denies need for O2, contrary to the proof of necessity previously encountered during syncopal episode/coughing/sob.  High/Low bed is being implemented at this time. Pt still refusing all education/compliance with safety protocols.

## 2017-02-11 NOTE — Progress Notes (Signed)
Nutrition Brief Note  RD consulted as part of COPD Gold protocol order set  Wt Readings from Last 15 Encounters:  02/10/17 207 lb (93.9 kg)  06/15/15 180 lb 9.6 oz (81.9 kg)  12/22/14 194 lb 14.2 oz (88.4 kg)  11/28/14 201 lb (91.2 kg)   Body mass index is 29.7 kg/m. Patient meets criteria for obese based on current BMI.   Current diet order is CC/NAS, patient is consuming approximately 100% of meals at this time. Labs and medications reviewed.   Pt had not reported any weight loss or lack of appetite on MST. When seen today, pt is agitated. He is mad about not being able to walk around and go to the cafeteria. He is upset at his diet order. He says his portion sizes are way too small and he is going hungry.   Agreed that he should not be going hungry, RD will order extra portions of protein and vegetables at his meal. Patient does not want to hear rationale behind diet order.   No nutrition interventions warranted at this time. If nutrition issues arise, please consult RD.   Burtis Junes RD, LDN, CNSC Clinical Nutrition Pager: [phone] 02/11/2017 11:08 AM

## 2017-02-11 NOTE — Progress Notes (Addendum)
Patient ID: Austin Stephenson, male   DOB: 16-Feb-1941, 76 y.o.   MRN: [id number]  PROGRESS NOTE    Austin Stephenson  GYF:[id number] DOB: August 03, 1940 DOA: 02/10/2017  PCP: System, Pcp Not In   Brief Narrative:  76 year old male with hypertension, DM, bipolar disorder, CAD s/p CABG, tobacco abuse, chronic diastolic CHF, CKD stage 3 who presented to ED with shortness of breath and cough for 10 days. He was dx with pneumonia early in the course of his illness and completed abx therapy but without significant improvement.. PT apparently ran out of lasix about 1 month prior to the admission. No fever or chills. Pt was hypoxic on admission with O2 sats 90% on 4 L [place]. EKG showed RBBB and sinus rhythm, LAFB. CXR showed mild interstitial edema. Troponin was WNL. He was given IV solumedrol on admission and IV lasix for COPD and acute CHF respectively.   Assessment & Plan:   Principal Problem: Acute respiratory failure with hypoxia / Acute COPD exacerbation - Hypoxia due to COPD and acute CHF - Continue duoneb every 4 hours scheduled and as needed for shortness of breath  - Continue solumedrol IV - Continue oxygen support via [place] to keep O2 sats above 90%  Active Problems:   Essential hypertension - Continue losartan and metoprolol     DM (diabetes mellitus), type 2, uncontrolled, with renal complications (HCC) - Continue Lantus 35 units at bedtime and Novolog 4 units TIDAC and SSI    CKD (chronic kidney disease) stage 3, GFR 30-59 ml/min - Monitor renal function while pt on lasix IV, reduced lasix to once a day as creatinine trending up    Bipolar disorder [facility]) - May use ativan as needed    Acute on chronic diastolic CHF (congestive heart failure) (HCC) - Continue IV lasix Q 24 hours - Replete electrolytes - Continue strict intake and output - Continue daily weight    Coronary artery disease - Continue daily aspirin    DVT prophylaxis: SCD's Code Status: full code  Family  Communication: no family at the bedside Disposition Plan: not yet stable for D/C, on IV lasix and also has wheezing    Consultants:   None  Procedures:   None   Antimicrobials:   None    Subjective: No overnight events.  Objective: Vitals:   02/11/17 0410 02/11/17 0700 02/11/17 0723 02/11/17 0930  BP: 134/66   (!) 169/60  Pulse: 84   93  Resp: 18   20  Temp: 97.9 F (36.6 C)   97.9 F (36.6 C)  TempSrc: Oral   Oral  SpO2: 95%  96% 95%  Weight:      Height:  5\' 10"  (1.778 m)      Intake/Output Summary (Last 24 hours) at 02/11/17 1255 Last data filed at 02/11/17 1028  Gross per 24 hour  Intake             1440 ml  Output             2200 ml  Net             -760 ml   Filed Weights   02/10/17 2129  Weight: 93.9 kg (207 lb)    Examination:  General exam: Appears calm and comfortable  Respiratory system: Wheezing in upper lung lobes, no rhonchi  Cardiovascular system: S1 & S2 heard, RRR.  Gastrointestinal system: Abdomen is nondistended, soft and nontender. No organomegaly or masses felt. Normal bowel sounds heard. Central nervous system: Alert  and oriented. No focal neurological deficits. Extremities: Symmetric 5 x 5 power. Skin: No rashes, lesions or ulcers Psychiatry: normal mood and behavior   Data Reviewed: I have personally reviewed following labs and imaging studies  CBC:  Recent Labs Lab 02/10/17 1700 02/11/17 0515  WBC 7.4 11.0*  NEUTROABS 5.6  --   HGB 14.8 15.9  HCT 44.7 47.5  MCV 95.1 96.0  PLT 161 287   Basic Metabolic Panel:  Recent Labs Lab 02/10/17 1700 02/11/17 0515  NA 142 135  K 3.7 4.6  CL 106 95*  CO2 28 28  GLUCOSE 113* 399*  BUN 18 25*  CREATININE 1.47* 1.82*  CALCIUM 8.8* 9.0   GFR: Estimated Creatinine Clearance: 39.8 mL/min (A) (by C-G formula based on SCr of 1.82 mg/dL (H)). Liver Function Tests:  Recent Labs Lab 02/10/17 1700  AST 22  ALT 19  ALKPHOS 109  BILITOT 0.8  PROT 6.2*  ALBUMIN 3.6    No results for input(s): LIPASE, AMYLASE in the last 168 hours. No results for input(s): AMMONIA in the last 168 hours. Coagulation Profile: No results for input(s): INR, PROTIME in the last 168 hours. Cardiac Enzymes:  Recent Labs Lab 02/10/17 2319 02/11/17 0515  TROPONINI 0.04* 0.03*   BNP (last 3 results) No results for input(s): PROBNP in the last 8760 hours. HbA1C: No results for input(s): HGBA1C in the last 72 hours. CBG:  Recent Labs Lab 02/10/17 2121 02/11/17 0734 02/11/17 1126  GLUCAP 356* 366* 300*   Lipid Profile: No results for input(s): CHOL, HDL, LDLCALC, TRIG, CHOLHDL, LDLDIRECT in the last 72 hours. Thyroid Function Tests: No results for input(s): TSH, T4TOTAL, FREET4, T3FREE, THYROIDAB in the last 72 hours. Anemia Panel: No results for input(s): VITAMINB12, FOLATE, FERRITIN, TIBC, IRON, RETICCTPCT in the last 72 hours. Urine analysis:    Component Value Date/Time   COLORURINE STRAW (A) 02/11/2017 0222   APPEARANCEUR CLEAR 02/11/2017 0222   LABSPEC 1.012 02/11/2017 0222   PHURINE 5.0 02/11/2017 0222   GLUCOSEU >=500 (A) 02/11/2017 0222   HGBUR SMALL (A) 02/11/2017 0222   BILIRUBINUR NEGATIVE 02/11/2017 0222   KETONESUR NEGATIVE 02/11/2017 0222   PROTEINUR NEGATIVE 02/11/2017 0222   NITRITE NEGATIVE 02/11/2017 0222   LEUKOCYTESUR NEGATIVE 02/11/2017 0222   Sepsis Labs: @LABRCNTIP (procalcitonin:4,lacticidven:4)   )No results found for this or any previous visit (from the past 240 hour(s)).    Radiology Studies: Dg Chest Portable 1 View  Result Date: 02/10/2017 CLINICAL DATA:  Shortness of breath for several days. Left chest pain. EXAM: PORTABLE CHEST 1 VIEW COMPARISON:  12/22/2014. FINDINGS: Stable enlarged cardiac silhouette and post CABG changes. Mild increase in prominence of the interstitial markings. No pleural fluid seen. Thoracic spine and right shoulder degenerative changes. IMPRESSION: 1. Interval mild interstitial pulmonary edema. 2.  Stable cardiomegaly. Electronically Signed   By: Claudie Revering M.D.   On: 02/10/2017 17:14        Scheduled Meds: . aspirin EC  81 mg Oral Daily  . atorvastatin  40 mg Oral q1800  . furosemide  40 mg Intravenous Q12H  . gabapentin  300 mg Oral TID  . heparin  5,000 Units Subcutaneous Q8H  . insulin aspart  0-5 Units Subcutaneous QHS  . insulin aspart  0-9 Units Subcutaneous TID WC  . insulin aspart  4 Units Subcutaneous TID WC  . insulin glargine  35 Units Subcutaneous QHS  . losartan  25 mg Oral Daily  . methylPREDNISol   40 mg Intravenous Q8H  .  metoprolol succin  50 mg Oral Daily  . mometasone-formo  2 puff Inhalation BID  . nicotine  21 mg Transdermal Once  . tamsulosin  0.8 mg Oral QPC breakfast   Continuous Infusions: . sodium chloride    . sodium chloride       LOS: 1 day    Time spent: 25 minutes  Greater than 50% of the time spent on counseling and coordinating the care.   Leisa Lenz, MD [facility] Pager [phone]  If 7PM-7AM, please contact night-coverage [email or web address] Password [facility] 02/11/2017, 12:55 PM

## 2017-02-11 NOTE — Progress Notes (Signed)
Page to Dr Charlies Silvers notifying of pt fall due to d/c own O2 and ambulating to restroom instead of using his urinal as previously discussed. Pt with minimal lac to forehead, presumably from impact to the left arm/IV access-hub. Pt heard when he hit the ground, on knees face on arm. Pt responsive able to stand upon assistance.   Pt argumentative regarding his refusal to use urinal outside of the bathroom.   Pt also insisting that he will leave AMA if we wont allow him to leave to floor or go to the bathroom unattended.    Dr Charlies Silvers informed of all preceding info, requested re-eval. MD states pt is stable, no need for transfer so we will just continue to monitor.    Pt now states that he was "sitting on the edge of the bed" after a successful ambulation to and from the bathroom.   This does actually coincide with pt's position/sudden occurrence.   Pt reminded to call out for any needs, to which he is still resistant.   High/Low Bed requested for patient. Yellow Socks/Band in place. High Fall risk implemented contrary to moderate risk previously assessed.

## 2017-02-11 NOTE — Progress Notes (Signed)
Pt is refusing all bed alarms. Stated by pt in fewer, less cordial terms.

## 2017-02-11 NOTE — Evaluation (Signed)
Physical Therapy Evaluation Patient Details Name: Austin Stephenson MRN: [id number] DOB: Jan 09, 1941 Today's Date: 02/11/2017   History of Present Illness  Hervey Wedig is a 76 y.o. male with medical history significant for hypertension, type 2 diabetes mellitus, bipolar disorder, coronary artery disease status post CABG, tobacco abuse, chronic diastolic CHF, and chronic kidney disease stage III, now presenting to the emergency department with dyspnea and cough for the past 10 days.   Clinical Impression  Pt admitted with above diagnosis. Pt currently with functional limitations due to the deficits listed below (see PT Problem List). Pt refused to ambulate if he could not walk down to cafeteria, discussed that this was not safe given that he just passed out and fell off bed this morning. Pt unsteady on feet, does not think that he needs RW but needed min A to prevent fall without AD. O2 sats remained in 90's on 2L O2 throughout session.  Pt will benefit from skilled PT to increase their independence and safety with mobility to allow discharge to the venue listed below.       Follow Up Recommendations Home health PT    Equipment Recommendations  Rolling walker with 5" wheels    Recommendations for Other Services       Precautions / Restrictions Precautions Precautions: Fall Precaution Comments: pt is passing out when he does not have O2 on and he coughs. Just happened this morning and he fell off EOB Restrictions Weight Bearing Restrictions: No      Mobility  Bed Mobility Overal bed mobility: Modified Independent             General bed mobility comments: pt able to get to EOB without physical assist but uses momentum and holds breath, cued to exhale as he moves  Transfers Overall transfer level: Needs assistance Equipment used: None Transfers: Sit to/from Stand Sit to Stand: Min guard         General transfer comment: pt stood safely but when he tried to take a  step, lost balance, min A to correct  Ambulation/Gait Ambulation/Gait assistance: Min guard   Assistive device: Rolling walker (2 wheeled)       General Gait Details: pt refused to ambulate in hall because I would let him walk to cafeteria. Did agree to march in place beside bed to try to loosen up tightness in chest  Stairs            Wheelchair Mobility    Modified Rankin (Stroke Patients Only)       Balance Overall balance assessment: Needs assistance Sitting-balance support: No upper extremity supported Sitting balance-Leahy Scale: Good     Standing balance support: No upper extremity supported Standing balance-Leahy Scale: Poor Standing balance comment: needs UE support for safety                             Pertinent Vitals/Pain Pain Assessment: No/denies pain    Home Living Family/patient expects to be discharged to:: Private residence Living Arrangements: Alone Available Help at Discharge: Available PRN/intermittently;Friend(s)             Additional Comments: difficult to get pt to give info and he is difficult to understand    Prior Function Level of Independence: Independent               Hand Dominance        Extremity/Trunk Assessment   Upper Extremity Assessment Upper Extremity Assessment: Overall [facility]  for tasks assessed    Lower Extremity Assessment Lower Extremity Assessment: Overall WFL for tasks assessed    Cervical / Trunk Assessment Cervical / Trunk Assessment: Normal  Communication   Communication: Expressive difficulties (mild slurring)  Cognition Arousal/Alertness: Awake/alert Behavior During Therapy: Agitated Overall Cognitive Status: Within Functional Limits for tasks assessed                                 General Comments: pt is frustrated because he says no one knows why he is passing out. He wants to walk down to cafeteria dn no one will let him. And he wants to smoke       General Comments General comments (skin integrity, edema, etc.): O2 sats 93% on 2L O2 but pt keeps taking off O2 and sats drop into 80's/     Exercises     Assessment/Plan    PT Assessment Patient needs continued PT services  PT Problem List Decreased strength;Decreased activity tolerance;Decreased balance;Decreased mobility;Cardiopulmonary status limiting activity;Decreased knowledge of precautions       PT Treatment Interventions DME instruction;Gait training;Stair training;Functional mobility training;Therapeutic activities;Therapeutic exercise;Patient/family education;Balance training    PT Goals (Current goals can be found in the Care Plan section)  Acute Rehab PT Goals Patient Stated Goal: "go home now" PT Goal Formulation: With patient Time For Goal Achievement: 02/25/17 Potential to Achieve Goals: Good    Frequency Min 3X/week   Barriers to discharge Decreased caregiver support girlfriend with him some of the time    Co-evaluation               AM-PAC PT "6 Clicks" Daily Activity  Outcome Measure Difficulty turning over in bed (including adjusting bedclothes, sheets and blankets)?: None Difficulty moving from lying on back to sitting on the side of the bed? : None Difficulty sitting down on and standing up from a chair with arms (e.g., wheelchair, bedside commode, etc,.)?: A Little Help needed moving to and from a bed to chair (including a wheelchair)?: A Little Help needed walking in hospital room?: A Little Help needed climbing 3-5 steps with a railing? : A Lot 6 Click Score: 19    End of Session   Activity Tolerance: Patient tolerated treatment well Patient left: in bed;with call bell/phone within reach Nurse Communication: Mobility status PT Visit Diagnosis: Unsteadiness on feet (R26.81);Other abnormalities of gait and mobility (R26.89)    Time: 1209-1230 PT Time Calculation (min) (ACUTE ONLY): 21 min   Charges:   PT Evaluation $PT Eval Moderate  Complexity: 1 Mod     PT G Codes:        Leighton Roach, PT  Acute Rehab Services  [place] 02/11/2017, 2:36 PM

## 2017-02-11 NOTE — Evaluation (Signed)
Occupational Therapy Evaluation Patient Details Name: Austin Stephenson MRN: [id number] DOB: 30-Apr-1941 Today's Date: 02/11/2017    History of Present Illness Austin Stephenson is a 76 y.o. male with medical history significant for hypertension, type 2 diabetes mellitus, bipolar disorder, coronary artery disease status post CABG, tobacco abuse, chronic diastolic CHF, and chronic kidney disease stage III, now presenting to the emergency department with dyspnea and cough for the past 10 days.    Clinical Impression   PTA, pt was living alone and performing his ADLs, IADLs, and driving; pt stated he has difficulty with cleaning. Pt currently presenting with decreased activity tolerance. Pt would benefit from acute OT for [facility] education and to increase safety and independence with ADLs and functional mobility. However, pt declining any further OT services stating "I don't want anymore of your programs." Educated pt on benefits of EC, purse lip breathing, and shower safety.  Recommend dc home once medically stable per physician. Per pt request, will sign off for acute OT. Thank you.    Follow Up Recommendations  No OT follow up;Supervision - Intermittent    Equipment Recommendations  None recommended by OT (Pt declining any DME/AE)    Recommendations for Other Services       Precautions / Restrictions Precautions Precautions: Fall Precaution Comments: pt is passing out when he does not have O2 on and he coughs. Just happened this morning and he fell off EOB Restrictions Weight Bearing Restrictions: No      Mobility Bed Mobility Overal bed mobility: Modified Independent             General bed mobility comments: pt able to get to EOB without physical assist but uses momentum and holds breath, cued to exhale as he moves  Transfers Overall transfer level: Needs assistance Equipment used: None Transfers: Sit to/from Stand Sit to Stand: Min guard         General transfer comment:  Pt stood quickly to prove he was able to stand and walk.    Balance Overall balance assessment: Needs assistance Sitting-balance support: No upper extremity supported Sitting balance-Leahy Scale: Good     Standing balance support: No upper extremity supported Standing balance-Leahy Scale: Fair Standing balance comment: Able to walk but was unstable                           ADL either performed or assessed with clinical judgement   ADL Overall ADL's : Needs assistance/impaired Eating/Feeding: Set up;Sitting   Grooming: Set up;Sitting   Upper Body Bathing: Set up;Supervision/ safety;Sitting   Lower Body Bathing: Min guard;Sit to/from stand   Upper Body Dressing : Set up;Supervision/safety;Sitting   Lower Body Dressing: Min guard;Sit to/from stand Lower Body Dressing Details (indicate cue type and reason): Pt donned/doffed sock with clear difficulty. Pt winded and O2 dropping during LB dressing. On roomair, pt O2 dropped to 83 during LB ADLs.             Functional mobility during ADLs: Min guard General ADL Comments: Evaluation limited by pt's frustration. Pt demonstrating poor activity tolerance and would benefit from [facility] and AE education, but states "I dont want any of those programs. I got too much paperwork at my house."     Vision Baseline Vision/History: Wears glasses Wears Glasses: At all times Patient Visual Report: No change from baseline       Perception     Praxis      Pertinent Vitals/Pain Pain Assessment:  No/denies pain     Hand Dominance Right   Extremity/Trunk Assessment Upper Extremity Assessment Upper Extremity Assessment: Overall WFL for tasks assessed   Lower Extremity Assessment Lower Extremity Assessment: Overall WFL for tasks assessed   Cervical / Trunk Assessment Cervical / Trunk Assessment: Normal   Communication Communication Communication: No difficulties (mild slurring)   Cognition Arousal/Alertness:  Awake/alert Behavior During Therapy: Agitated;Impulsive Overall Cognitive Status: Within Functional Limits for tasks assessed                                 General Comments: Pt escalates quickly and becomes frustrated.   General Comments  low 80s on roomair and 93 on 2.5L    Exercises     Shoulder Instructions      Home Living Family/patient expects to be discharged to:: Private residence Living Arrangements: Alone Available Help at Discharge: Available PRN/intermittently;Friend(s)         Home Layout: One level     Bathroom Shower/Tub: Occupational psychologist: Standard     Home Equipment: None   Additional Comments: Pt providing little information on home set up. Pt has pet parriot.       Prior Functioning/Environment Level of Independence: Independent        Comments: ADLs, IADLs, and driving.         OT Problem List: Decreased range of motion;Decreased activity tolerance;Impaired balance (sitting and/or standing);Decreased safety awareness;Decreased knowledge of use of DME or AE      OT Treatment/Interventions:      OT Goals(Current goals can be found in the care plan section) Acute Rehab OT Goals Patient Stated Goal: "Go home and talk to my parrot" OT Goal Formulation: With patient Time For Goal Achievement: 02/25/17 Potential to Achieve Goals: Good  OT Frequency:     Barriers to D/C:            Co-evaluation              AM-PAC PT "6 Clicks" Daily Activity     Outcome Measure Help from another person eating meals?: None Help from another person taking care of personal grooming?: A Little Help from another person toileting, which includes using toliet, bedpan, or urinal?: A Little Help from another person bathing (including washing, rinsing, drying)?: A Little Help from another person to put on and taking off regular upper body clothing?: None Help from another person to put on and taking off regular lower body  clothing?: A Little 6 Click Score: 20   End of Session Equipment Utilized During Treatment: Oxygen Nurse Communication: Mobility status;Other (comment) (O2 stats levels)  Activity Tolerance: Patient tolerated treatment well;Patient limited by fatigue Patient left: in bed;with call bell/phone within reach  OT Visit Diagnosis: Unsteadiness on feet (R26.81);Other abnormalities of gait and mobility (R26.89)                Time: 1017-5102 OT Time Calculation (min): 18 min Charges:  OT General Charges $OT Visit: 1 Visit OT Evaluation $OT Eval Moderate Complexity: 1 Mod G-Codes:     [place] MSOT, OTR/L Acute Rehab Pager: [phone] Office: [place] 02/11/2017, 5:06 PM

## 2017-02-11 NOTE — Progress Notes (Signed)
Respiratory called for breathing treatment.

## 2017-02-11 NOTE — Progress Notes (Signed)
Page to Dr Charlies Silvers, pt requesting reflux medication.

## 2017-02-11 NOTE — Progress Notes (Signed)
PT Cancellation Note  Patient Details Name: Austin Stephenson MRN: [id number] DOB: June 16, 1940   Cancelled Treatment:    Reason Eval/Treat Not Completed: Patient not medically ready, pt noted to have elevated troponin. Will hold PT for now and check back later in day.    [place] 02/11/2017, 8:11 AM

## 2017-02-12 LAB — GLUCOSE, CAPILLARY
GLUCOSE-CAPILLARY: 433 mg/dL — AB (ref 65–99)
Glucose-Capillary: 404 mg/dL — ABNORMAL HIGH (ref 65–99)
Glucose-Capillary: 463 mg/dL — ABNORMAL HIGH (ref 65–99)
Glucose-Capillary: 472 mg/dL — ABNORMAL HIGH (ref 65–99)

## 2017-02-12 LAB — CBC
HEMATOCRIT: 44.4 % (ref 39.0–52.0)
Hemoglobin: 14.8 g/dL (ref 13.0–17.0)
MCH: 32 pg (ref 26.0–34.0)
MCHC: 33.3 g/dL (ref 30.0–36.0)
MCV: 95.9 fL (ref 78.0–100.0)
PLATELETS: 142 10*3/uL — AB (ref 150–400)
RBC: 4.63 MIL/uL (ref 4.22–5.81)
RDW: 12.8 % (ref 11.5–15.5)
WBC: 14.2 10*3/uL — ABNORMAL HIGH (ref 4.0–10.5)

## 2017-02-12 LAB — BASIC METABOLIC PANEL
Anion gap: 11 (ref 5–15)
BUN: 39 mg/dL — AB (ref 6–20)
CHLORIDE: 97 mmol/L — AB (ref 101–111)
CO2: 24 mmol/L (ref 22–32)
CREATININE: 1.66 mg/dL — AB (ref 0.61–1.24)
Calcium: 8.7 mg/dL — ABNORMAL LOW (ref 8.9–10.3)
GFR calc Af Amer: 45 mL/min — ABNORMAL LOW (ref >60)
GFR calc non Af Amer: 38 mL/min — ABNORMAL LOW (ref >60)
GLUCOSE: 424 mg/dL — AB (ref 65–99)
POTASSIUM: 5.1 mmol/L (ref 3.5–5.1)
Sodium: 132 mmol/L — ABNORMAL LOW (ref 135–145)

## 2017-02-12 MED ORDER — IPRATROPIUM-ALBUTEROL 0.5-2.5 (3) MG/3ML IN SOLN
3.0000 mL | Freq: Three times a day (TID) | RESPIRATORY_TRACT | Status: DC
  Administered 2017-02-12 – 2017-02-14 (×6): 3 mL via RESPIRATORY_TRACT
  Filled 2017-02-12 (×6): qty 3

## 2017-02-12 MED ORDER — INSULIN ASPART 100 UNIT/ML ~~LOC~~ SOLN
15.0000 [IU] | Freq: Three times a day (TID) | SUBCUTANEOUS | Status: DC
  Administered 2017-02-12 – 2017-02-14 (×6): 15 [IU] via SUBCUTANEOUS

## 2017-02-12 MED ORDER — INSULIN ASPART 100 UNIT/ML ~~LOC~~ SOLN
10.0000 [IU] | Freq: Once | SUBCUTANEOUS | Status: AC
  Administered 2017-02-12: 10 [IU] via SUBCUTANEOUS

## 2017-02-12 MED ORDER — INSULIN ASPART 100 UNIT/ML ~~LOC~~ SOLN: 8.0000 [IU] | Freq: Once | SUBCUTANEOUS | Status: DC

## 2017-02-12 MED ORDER — INSULIN GLARGINE 100 UNIT/ML ~~LOC~~ SOLN
40.0000 [IU] | Freq: Every day | SUBCUTANEOUS | Status: DC
  Administered 2017-02-12: 40 [IU] via SUBCUTANEOUS
  Filled 2017-02-12: qty 0.4

## 2017-02-12 MED ORDER — INSULIN ASPART 100 UNIT/ML ~~LOC~~ SOLN: 10.0000 [IU] | Freq: Three times a day (TID) | SUBCUTANEOUS | Status: DC

## 2017-02-12 MED ORDER — PANTOPRAZOLE SODIUM 40 MG PO TBEC
40.0000 mg | DELAYED_RELEASE_TABLET | Freq: Every day | ORAL | Status: DC
  Administered 2017-02-12 – 2017-02-14 (×3): 40 mg via ORAL
  Filled 2017-02-12 (×3): qty 1

## 2017-02-12 MED ORDER — NICOTINE 21 MG/24HR TD PT24
21.0000 mg | MEDICATED_PATCH | Freq: Every day | TRANSDERMAL | Status: DC
  Administered 2017-02-12 – 2017-02-14 (×3): 21 mg via TRANSDERMAL
  Filled 2017-02-12 (×3): qty 1

## 2017-02-12 NOTE — Progress Notes (Signed)
Provider notified regarding blood sugar per order and request for nicotine patch. New order given.

## 2017-02-12 NOTE — Progress Notes (Signed)
Page to Dr Charlies Silvers to notify of elevated CBG, requested adjusted order for insulin.  Verbal order received to administer 19 units total. Scanned/documented in [facility] under SSI order.  Meal coverage order marked other not given.

## 2017-02-12 NOTE — Progress Notes (Signed)
Patient ID: Austin Stephenson, male   DOB: Aug 09, 1940, 76 y.o.   MRN: [id number]  PROGRESS NOTE    Demba Nigh  KZS:[id number] DOB: 1940/07/29 DOA: 02/10/2017  PCP: System, Pcp Not In   Brief Narrative:  76 year old male with hypertension, DM, bipolar disorder, CAD s/p CABG, tobacco abuse, chronic diastolic CHF, CKD stage 3 who presented to ED with shortness of breath and cough for 10 days. He was dx with pneumonia early in the course of his illness and completed abx therapy but without significant improvement.. PT apparently ran out of lasix about 1 month prior to the admission. No fever or chills. Pt was hypoxic on admission with O2 sats 90% on 4 L [place]. EKG showed RBBB and sinus rhythm, LAFB. CXR showed mild interstitial edema. Troponin was WNL. He was given IV solumedrol on admission and IV lasix for COPD and acute CHF respectively.   Assessment & Plan:   Principal Problem: Acute respiratory failure with hypoxia / Acute COPD exacerbation - Hypoxia likely secondary to combination of COPD and CHF - Patient is still wheezing on physical exam - Continue DuoNeb every 4 hours as needed for shortness of breath or wheezing - Continue Solu-Medrol 40 mg IV every 12 hours - Continue oxygen support via nasal cannula to keep oxygen saturation above 90%  Active Problems:   Essential hypertension - Continue losartan and metoprolol    DM (diabetes mellitus), type 2, uncontrolled, with renal complications (HCC) - Continue current insulin regimen, Lantus 35 units at bedtime, NovoLog 4 units 3 times daily with meals and sliding scale insulin     CKD (chronic kidney disease) stage 3, GFR 30-59 ml/min - BMP pending this morning    Acute on chronic diastolic CHF (congestive heart failure) (HCC) - Continue Lasix 40 mg daily - Follow-up on renal function this morning - Continue daily weight and strict intake and output - Weight on September 23 is 93.5 kg    Coronary artery disease - Continue  daily aspirin    BPH - Continue Flomax    Dyslipidemia associated with type 2 diabetes mellitus - Continue Lipitor 40 mg at bedtime   DVT prophylaxis: SCD's  Code Status: full code  Family Communication: no family at the bedside this am Disposition Plan: pt not yet stable for discharge    Consultants:   None   Procedures:   None   Antimicrobials:   None    Subjective: No overnight events.  Objective: Vitals:   02/11/17 0930 02/11/17 1300 02/11/17 1937 02/12/17 0552  BP: (!) 169/60 (!) 136/57 126/62 128/65  Pulse: 93 92 80 81  Resp: 20 18 18 18   Temp: 97.9 F (36.6 C) 98.9 F (37.2 C) 98.2 F (36.8 C) 98 F (36.7 C)  TempSrc: Oral Oral Oral Oral  SpO2: 95% 94% 94% 95%  Weight:    93.5 kg (206 lb 1.6 oz)  Height:        Intake/Output Summary (Last 24 hours) at 02/12/17 0718 Last data filed at 02/12/17 0604  Gross per 24 hour  Intake             1440 ml  Output             3280 ml  Net            -1840 ml   Filed Weights   02/10/17 2129 02/12/17 0552  Weight: 93.9 kg (207 lb) 93.5 kg (206 lb 1.6 oz)    Physical Exam  Constitutional:  Appears well-developed and well-nourished. No distress.  CVS: RRR, S1/S2 + Pulmonary: wheezing in upper lung lobes, no rhonchi  Abdominal: Soft. BS +,  no distension, tenderness, rebound or guarding.  Musculoskeletal: Normal range of motion. No edema and no tenderness.  Lymphadenopathy: No lymphadenopathy noted, cervical, inguinal. Neuro: Alert. Normal reflexes, muscle tone coordination. No cranial nerve deficit. Skin: Skin is warm and dry.  Psychiatric: Normal mood and affect. Behavior, judgment, thought content normal.     Data Reviewed: I have personally reviewed following labs and imaging studies  CBC:  Recent Labs Lab 02/10/17 1700 02/11/17 0515  WBC 7.4 11.0*  NEUTROABS 5.6  --   HGB 14.8 15.9  HCT 44.7 47.5  MCV 95.1 96.0  PLT 161 124   Basic Metabolic Panel:  Recent Labs Lab 02/10/17 1700  02/11/17 0515  NA 142 135  K 3.7 4.6  CL 106 95*  CO2 28 28  GLUCOSE 113* 399*  BUN 18 25*  CREATININE 1.47* 1.82*  CALCIUM 8.8* 9.0   GFR: Estimated Creatinine Clearance: 39.7 mL/min (A) (by C-G formula based on SCr of 1.82 mg/dL (H)). Liver Function Tests:  Recent Labs Lab 02/10/17 1700  AST 22  ALT 19  ALKPHOS 109  BILITOT 0.8  PROT 6.2*  ALBUMIN 3.6   No results for input(s): LIPASE, AMYLASE in the last 168 hours. No results for input(s): AMMONIA in the last 168 hours. Coagulation Profile: No results for input(s): INR, PROTIME in the last 168 hours. Cardiac Enzymes:  Recent Labs Lab 02/10/17 2319 02/11/17 0515  TROPONINI 0.04* 0.03*   BNP (last 3 results) No results for input(s): PROBNP in the last 8760 hours. HbA1C: No results for input(s): HGBA1C in the last 72 hours. CBG:  Recent Labs Lab 02/10/17 2121 02/11/17 0734 02/11/17 1126 02/11/17 1702 02/11/17 2052  GLUCAP 356* 366* 300* 305* 289*   Lipid Profile: No results for input(s): CHOL, HDL, LDLCALC, TRIG, CHOLHDL, LDLDIRECT in the last 72 hours. Thyroid Function Tests: No results for input(s): TSH, T4TOTAL, FREET4, T3FREE, THYROIDAB in the last 72 hours. Anemia Panel: No results for input(s): VITAMINB12, FOLATE, FERRITIN, TIBC, IRON, RETICCTPCT in the last 72 hours. Urine analysis:    Component Value Date/Time   COLORURINE STRAW (A) 02/11/2017 0222   APPEARANCEUR CLEAR 02/11/2017 0222   LABSPEC 1.012 02/11/2017 0222   PHURINE 5.0 02/11/2017 0222   GLUCOSEU >=500 (A) 02/11/2017 0222   HGBUR SMALL (A) 02/11/2017 0222   BILIRUBINUR NEGATIVE 02/11/2017 0222   KETONESUR NEGATIVE 02/11/2017 0222   PROTEINUR NEGATIVE 02/11/2017 0222   NITRITE NEGATIVE 02/11/2017 0222   LEUKOCYTESUR NEGATIVE 02/11/2017 0222   Sepsis Labs: @LABRCNTIP (procalcitonin:4,lacticidven:4)   )No results found for this or any previous visit (from the past 240 hour(s)).    Radiology Studies: Dg Chest Portable 1  View  Result Date: 02/10/2017 CLINICAL DATA:  Shortness of breath for several days. Left chest pain. EXAM: PORTABLE CHEST 1 VIEW COMPARISON:  12/22/2014. FINDINGS: Stable enlarged cardiac silhouette and post CABG changes. Mild increase in prominence of the interstitial markings. No pleural fluid seen. Thoracic spine and right shoulder degenerative changes. IMPRESSION: 1. Interval mild interstitial pulmonary edema. 2. Stable cardiomegaly. Electronically Signed   By: Claudie Revering M.D.   On: 02/10/2017 17:14     Scheduled Meds: . aspirin EC  81 mg Oral Daily  . atorvastatin  40 mg Oral q1800  . furosemide  40 mg Intravenous Q12H  . gabapentin  300 mg Oral TID  . heparin  5,000  Units Subcutaneous Q8H  . insulin aspart  0-5 Units Subcutaneous QHS  . insulin aspart  0-9 Units Subcutaneous TID WC  . insulin aspart  4 Units Subcutaneous TID WC  . insulin glargine  35 Units Subcutaneous QHS  . losartan  25 mg Oral Daily  . methylPREDNISol   40 mg Intravenous Q8H  . metoprolol succin  50 mg Oral Daily  . mometasone-formo  2 puff Inhalation BID  . nicotine  21 mg Transdermal Once  . tamsulosin  0.8 mg Oral QPC breakfast   Continuous Infusions:    LOS: 2 days    Time spent: 25 minutes  Greater than 50% of the time spent on counseling and coordinating the care.   Leisa Lenz, MD [facility] Pager [phone]  If 7PM-7AM, please contact night-coverage [email or web address] Password [facility] 02/12/2017, 7:18 AM

## 2017-02-12 NOTE — Progress Notes (Signed)
Pt still aggressive with staff, refusing alarms, refusing to keep O2 on. Refusing to use urinal outside of restroom.

## 2017-02-12 NOTE — Progress Notes (Signed)
Page to Dr Charlies Silvers  3east14, CBG elevated again. 433. please advise total dose of insulin desired. thank you.

## 2017-02-12 NOTE — Progress Notes (Signed)
Page to Dr Charlies Silvers re pts elevated CBG this am.  Sticky note placed yesterday evening regarding pts statement of inadequate insulin dosing compared to his home routine.

## 2017-02-13 LAB — BASIC METABOLIC PANEL
Anion gap: 7 (ref 5–15)
BUN: 43 mg/dL — AB (ref 6–20)
CALCIUM: 8.5 mg/dL — AB (ref 8.9–10.3)
CO2: 31 mmol/L (ref 22–32)
CREATININE: 1.75 mg/dL — AB (ref 0.61–1.24)
Chloride: 98 mmol/L — ABNORMAL LOW (ref 101–111)
GFR calc non Af Amer: 36 mL/min — ABNORMAL LOW (ref >60)
GFR, EST AFRICAN AMERICAN: 42 mL/min — AB (ref >60)
GLUCOSE: 489 mg/dL — AB (ref 65–99)
Potassium: 5 mmol/L (ref 3.5–5.1)
Sodium: 136 mmol/L (ref 135–145)

## 2017-02-13 LAB — GLUCOSE, CAPILLARY
GLUCOSE-CAPILLARY: 428 mg/dL — AB (ref 65–99)
Glucose-Capillary: 448 mg/dL — ABNORMAL HIGH (ref 65–99)

## 2017-02-13 MED ORDER — INSULIN GLARGINE 100 UNIT/ML ~~LOC~~ SOLN
50.0000 [IU] | Freq: Every day | SUBCUTANEOUS | Status: DC
  Administered 2017-02-13: 50 [IU] via SUBCUTANEOUS
  Filled 2017-02-13 (×2): qty 0.5

## 2017-02-13 MED ORDER — PREDNISONE 50 MG PO TABS
50.0000 mg | ORAL_TABLET | Freq: Every day | ORAL | Status: DC
  Administered 2017-02-14: 50 mg via ORAL
  Filled 2017-02-13: qty 1

## 2017-02-13 NOTE — Progress Notes (Signed)
Inpatient Diabetes Program Recommendations  AACE/ADA: New Consensus Statement on Inpatient Glycemic Control (2015)  Target Ranges:  Prepandial:   less than 140 mg/dL      Peak postprandial:   less than 180 mg/dL (1-2 hours)      Critically ill patients:  140 - 180 mg/dL   Results for Austin Stephenson, Austin Stephenson (MRN [id number]) as of 02/13/2017 10:35  Ref. Range 02/12/2017 07:25 02/12/2017 12:04 02/12/2017 16:30 02/12/2017 20:59 02/13/2017 07:38  Glucose-Capillary Latest Ref Range: 65 - 99 mg/dL 404 (H) 463 (H) 433 (H) 472 (H) 428 (H)   Review of Glycemic Control  Diabetes history: DM2 Outpatient Diabetes medications: Lantus 40 units QHS, Glipizide 2.5 mg BID, Novolog 10 units TID with meals  Current orders for Inpatient glycemic control: Lantus 40 units QHS, Novolog 0-9 units TID with meals, Novolog 0-5 units QHS, Novolog 15 units TID with meals for meal coverage   Inpatient Diabetes Program Recommendations: Insulin - Basal: Fasting glucose 428 mg/dl this morning and ordered Solumedrol 40 mg Q12H. Please consider increasing Lantus to 50 units QHS. Correction (SSI): Please consider increasing Novolog correction scale to Novolog 0-20 units TID with meals. Insulin - Meal Coverage: If steroids are continued as ordered, please consider increasing meal coverage to Novolog 20 units TID with meals. HgbA1C: Please consider ordering an A1C to evaluate glycemic control over the past 2-3 months. Diet: Please discontinue Regular diet and order Carb Modified diet.  Thanks, Barnie Alderman, RN, MSN, CDE Diabetes Coordinator Inpatient Diabetes Program [phone] (Team Pager from 8am to 5pm)

## 2017-02-13 NOTE — Progress Notes (Signed)
Physical Therapy Treatment Patient Details Name: Austin Stephenson MRN: [id number] DOB: 01-06-1941 Today's Date: 02/13/2017    History of Present Illness Austin Stephenson is a 76 y.o. male with medical history significant for hypertension, type 2 diabetes mellitus, bipolar disorder, coronary artery disease status post CABG, tobacco abuse, chronic diastolic CHF, and chronic kidney disease stage III, now presenting to the emergency department with dyspnea and cough for the past 10 days.     PT Comments    Pt performed increased activity and performed gait during session.  Pt refused use of RW reporting he staggers at baseline and only falls because he passes out when he coughs to hard.  Pt educated on need for use of RW post session.  Pt will continue to benefit from rehab during hospitalization.  Plan for gait with RW to improve safety next session.     Follow Up Recommendations  Home health PT     Equipment Recommendations  Rolling walker with 5" wheels    Recommendations for Other Services       Precautions / Restrictions Precautions Precautions: Fall Precaution Comments: Pt with history of passing out when he does not have O2 on and he coughs. He fell off EOB 9/22 Restrictions Weight Bearing Restrictions: No    Mobility  Bed Mobility Overal bed mobility: Modified Independent             General bed mobility comments: Pt performed sit to supine and supine to sit without support or assistance.    Transfers Overall transfer level: Needs assistance Equipment used: None Transfers: Sit to/from Stand Sit to Stand: Supervision         General transfer comment: Pt stood with supervision for safety based on history of passing out.  Pt demonstrates good technique.    Ambulation/Gait Ambulation/Gait assistance: Min guard Ambulation Distance (Feet): 200 Feet Assistive device: None (Pt would benefit from RW) Gait Pattern/deviations: Step-through pattern;Decreased stride  length;Staggering right;Staggering left;Trunk flexed   Gait velocity interpretation: Below normal speed for age/gender General Gait Details: Pt staggering to the R and to the L during gait.  Pt would benefit from a RW for added support due to patient's balance and neuropathy.  Pt with increased WOB and desaturation noted with activity.  Pt required 2L after gait training to improve O2 sats to 93%.  Pt desaturated on RA during gait to 82% on RA. Pt required instructional pursed lip breathing during session.     Stairs            Wheelchair Mobility    Modified Rankin (Stroke Patients Only)       Balance     Sitting balance-Leahy Scale: Good       Standing balance-Leahy Scale: Poor                              Cognition Arousal/Alertness: Awake/alert Behavior During Therapy: Impulsive Overall Cognitive Status: Within Functional Limits for tasks assessed                                        Exercises General Exercises - Lower Extremity Ankle Circles/Pumps: AROM;Both;10 reps;Supine Quad Sets: AROM;Both;10 reps;Supine Heel Slides: AROM;10 reps;Supine;Both Hip ABduction/ADduction: AROM;Both;10 reps;Supine Straight Leg Raises: AROM;Both;10 reps;Supine    General Comments        Pertinent Vitals/Pain Pain Assessment: 0-10 Pain Score: 8  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   Pain Descriptors / Indicators: Burning Pain Intervention(s): Monitored during session;Repositioned;Patient requesting pain meds-RN notified    Home Living                      Prior Function            PT Goals (current goals can now be found in the care plan section) Acute Rehab PT Goals Patient Stated Goal: To get some pain medicine for his neuropathy in his feet.   Potential to Achieve Goals: Good Progress towards PT goals: Progressing toward goals    Frequency    Min 3X/week      PT Plan Current plan remains appropriate     Co-evaluation              AM-PAC PT "6 Clicks" Daily Activity  Outcome Measure  Difficulty turning over in bed (including adjusting bedclothes, sheets and blankets)?: None Difficulty moving from lying on back to sitting on the side of the bed? : None Difficulty sitting down on and standing up from a chair with arms (e.g., wheelchair, bedside commode, etc,.)?: A Little Help needed moving to and from a bed to chair (including a wheelchair)?: A Little Help needed walking in hospital room?: A Little Help needed climbing 3-5 steps with a railing? : A Little 6 Click Score: 20    End of Session Equipment Utilized During Treatment: Gait belt Activity Tolerance: Patient tolerated treatment well Patient left: in bed;with call bell/phone within reach Nurse Communication: Mobility status PT Visit Diagnosis: Unsteadiness on feet (R26.81);Other abnormalities of gait and mobility (R26.89)     Time: 4315-4008 PT Time Calculation (min) (ACUTE ONLY): 19 min  Charges:  $Gait Training: 8-22 mins                    G Codes:       Austin Stephenson, PTA pager [phone]    Austin Stephenson 02/13/2017, 4:24 PM

## 2017-02-13 NOTE — Progress Notes (Signed)
Patient sleeping soundly at this time. Prior to bedtime conversation had with patient regarding his recent fall. Patient is on low due to fall this admission, otherwise he does not meet criteria. Patient refuses bed alarm despite recent fall. Patient did agree to leave door open for visibility during the night. Frequent rounding initiated.

## 2017-02-13 NOTE — Progress Notes (Signed)
Patient very agitated about everything. He is upset that diabetes causes neuropathy. Patient has been medicated and will also received prn medication. Patient easily angered, but them calms down and apologizes.

## 2017-02-13 NOTE — Progress Notes (Signed)
Paged MD to make her aware CBG is 464. Awaiting callback.

## 2017-02-13 NOTE — Progress Notes (Signed)
Paged MD regarding CBG of 448.

## 2017-02-13 NOTE — Progress Notes (Signed)
Patient ID: Austin Stephenson, male   DOB: [birth year], 76 y.o.   MRN: [id number]  PROGRESS NOTE    Hosea Hanawalt  MGQ:[id number] DOB: [birth year] DOA: 02/10/2017  PCP: System, Pcp Not In   Brief Narrative:  76 year old male with hypertension, DM, bipolar disorder, CAD s/p CABG, tobacco abuse, chronic diastolic CHF, CKD stage 3 who presented to ED with shortness of breath and cough for 10 days. He was dx with pneumonia early in the course of his illness and completed abx therapy but without significant improvement.. PT apparently ran out of lasix about 1 month prior to the admission. No fever or chills. Pt was hypoxic on admission with O2 sats 90% on 4 L [place]. EKG showed RBBB and sinus rhythm, LAFB. CXR showed mild interstitial edema. Troponin was WNL. He was given IV solumedrol on admission and IV lasix for COPD and acute CHF respectively.    Assessment & Plan:   Principal Problem: Acute respiratory failure with hypoxia / Acute COPD exacerbation - Hypoxia likely secondary to combination of COPD and CHF - Continue duoneb every 8 hours scheduled and as needed for shortness of breath or wheezing  - Stop steroids IV and use PO prednisone from tomorrow (order placed) - Continue oxygen support via [place] to keep O2 sats above 90%  Active Problems:   Essential hypertension - Continue losartan and metoprolol     DM (diabetes mellitus), type 2, uncontrolled, with renal complications (HCC) - Also hyperglycemia secondary to steroids - Switching to po prednisone tomorrow, stopped IV steroids today - Changed lantus to 50 units at bedtime - Continue novolog 15 units TID - Continue SSI - Continue gabapentin for diabetic neuropathy     CKD (chronic kidney disease) stage 3, GFR 30-59 ml/min - Cr 1.75 this am, slightly up from yesterday - Monitor daily BMP as pt on IV Lasix     Acute on chronic diastolic CHF (congestive heart failure) (HCC) - Continue daily lasix  - Weight pending this am -  Weight 9/23 93.5 kg - Continue strict intake and output     Coronary artery disease - Continue aspirin     BPH - Continue Flomax    Dyslipidemia associated with type 2 diabetes mellitus - Continue Lipitor    Tobacco use - Nicotine patch ordered - Counseled on cessation   DVT prophylaxis: SCD's Code Status: full code  Family Communication: no family at bedside Disposition Plan: possible d/c in 1-2 days, still wheezing    Consultants:   None  Procedures:   None  Antimicrobials:   None   Subjective: Wants to go home.   Objective: Vitals:   02/13/17 0354 02/13/17 0847 02/13/17 0900 02/13/17 1151  BP: (!) 155/60  (!) 143/71 (!) 141/60  Pulse: 91 81 84 74  Resp: 18 16 18 20   Temp: 97.7 F (36.5 C)   98.2 F (36.8 C)  TempSrc: Oral   Oral  SpO2: 93% 92% 91% 92%  Weight: 93.7 kg (206 lb 9.6 oz)     Height:        Intake/Output Summary (Last 24 hours) at 02/13/17 1249 Last data filed at 02/13/17 0532  Gross per 24 hour  Intake              520 ml  Output              300 ml  Net              220 ml   Filed  Weights   02/10/17 2129 02/12/17 0552 02/13/17 0354  Weight: 93.9 kg (207 lb) 93.5 kg (206 lb 1.6 oz) 93.7 kg (206 lb 9.6 oz)    Examination:  General exam: Appears calm and comfortable  Respiratory system: wheezing but no rhonchi  Cardiovascular system: S1 & S2 heard, Rate controlled  Gastrointestinal system: Abdomen is nondistended, soft and nontender. No organomegaly or masses felt. Normal bowel sounds heard. Central nervous system: Alert and oriented. No focal neurological deficits. Extremities: Symmetric 5 x 5 power. Skin: No rashes, lesions or ulcers Psychiatry: Judgement and insight appear normal. Mood & affect appropriate.   Data Reviewed: I have personally reviewed following labs and imaging studies  CBC:  Recent Labs Lab 02/10/17 1700 02/11/17 0515 02/12/17 0718  WBC 7.4 11.0* 14.2*  NEUTROABS 5.6  --   --   HGB 14.8 15.9  14.8  HCT 44.7 47.5 44.4  MCV 95.1 96.0 95.9  PLT 161 154 371*   Basic Metabolic Panel:  Recent Labs Lab 02/10/17 1700 02/11/17 0515 02/12/17 0718 02/13/17 0431  NA 142 135 132* 136  K 3.7 4.6 5.1 5.0  CL 106 95* 97* 98*  CO2 28 28 24 31   GLUCOSE 113* 399* 424* 489*  BUN 18 25* 39* 43*  CREATININE 1.47* 1.82* 1.66* 1.75*  CALCIUM 8.8* 9.0 8.7* 8.5*   GFR: Estimated Creatinine Clearance: 41.3 mL/min (A) (by C-G formula based on SCr of 1.75 mg/dL (H)). Liver Function Tests:  Recent Labs Lab 02/10/17 1700  AST 22  ALT 19  ALKPHOS 109  BILITOT 0.8  PROT 6.2*  ALBUMIN 3.6   No results for input(s): LIPASE, AMYLASE in the last 168 hours. No results for input(s): AMMONIA in the last 168 hours. Coagulation Profile: No results for input(s): INR, PROTIME in the last 168 hours. Cardiac Enzymes:  Recent Labs Lab 02/10/17 2319 02/11/17 0515  TROPONINI 0.04* 0.03*   BNP (last 3 results) No results for input(s): PROBNP in the last 8760 hours. HbA1C: No results for input(s): HGBA1C in the last 72 hours. CBG:  Recent Labs Lab 02/12/17 0725 02/12/17 1204 02/12/17 1630 02/12/17 2059 02/13/17 0738  GLUCAP 404* 463* 433* 472* 428*   Lipid Profile: No results for input(s): CHOL, HDL, LDLCALC, TRIG, CHOLHDL, LDLDIRECT in the last 72 hours. Thyroid Function Tests: No results for input(s): TSH, T4TOTAL, FREET4, T3FREE, THYROIDAB in the last 72 hours. Anemia Panel: No results for input(s): VITAMINB12, FOLATE, FERRITIN, TIBC, IRON, RETICCTPCT in the last 72 hours. Urine analysis:    Component Value Date/Time   COLORURINE STRAW (A) 02/11/2017 0222   APPEARANCEUR CLEAR 02/11/2017 0222   LABSPEC 1.012 02/11/2017 0222   PHURINE 5.0 02/11/2017 0222   GLUCOSEU >=500 (A) 02/11/2017 0222   HGBUR SMALL (A) 02/11/2017 0222   BILIRUBINUR NEGATIVE 02/11/2017 0222   KETONESUR NEGATIVE 02/11/2017 0222   PROTEINUR NEGATIVE 02/11/2017 0222   NITRITE NEGATIVE 02/11/2017 0222    LEUKOCYTESUR NEGATIVE 02/11/2017 0222   Sepsis Labs: @LABRCNTIP (procalcitonin:4,lacticidven:4)   )No results found for this or any previous visit (from the past 240 hour(s)).    Radiology Studies: Dg Chest Portable 1 View  Result Date: 02/10/2017 CLINICAL DATA:  Shortness of breath for several days. Left chest pain. EXAM: PORTABLE CHEST 1 VIEW COMPARISON:  12/22/2014. FINDINGS: Stable enlarged cardiac silhouette and post CABG changes. Mild increase in prominence of the interstitial markings. No pleural fluid seen. Thoracic spine and right shoulder degenerative changes. IMPRESSION: 1. Interval mild interstitial pulmonary edema. 2. Stable cardiomegaly. Electronically Signed  By: Claudie Revering M.D.   On: 02/10/2017 17:14        Scheduled Meds: . aspirin EC  81 mg Oral Daily  . atorvastatin  40 mg Oral q1800  . furosemide  40 mg Intravenous Daily  . gabapentin  300 mg Oral TID  . insulin aspart  0-5 Units Subcutaneous QHS  . insulin aspart  0-9 Units Subcutaneous TID WC  . insulin aspart  15 Units Subcutaneous TID WC  . insulin aspart  8 Units Subcutaneous Once  . insulin glargine  40 Units Subcutaneous QHS  . ipratropium-albuterol  3 mL Nebulization TID  . losartan  25 mg Oral Daily  . metoprolol succinate  50 mg Oral Daily  . nicotine  21 mg Transdermal Daily  . pantoprazole  40 mg Oral Daily  . [START ON 02/14/2017] predniSONE  50 mg Oral Q breakfast  . tamsulosin  0.8 mg Oral QPC breakfast   Continuous Infusions:   LOS: 3 days    Time spent: 25 minutes  Greater than 50% of the time spent on counseling and coordinating the care.   Leisa Lenz, MD [facility] Pager [phone]  If 7PM-7AM, please contact night-coverage [email or web address] Password [facility] 02/13/2017, 12:49 PM

## 2017-02-13 NOTE — Progress Notes (Signed)
PT alert and oriented and refusing bed alarm. Pt has been educated and still refuses. Will monitor.

## 2017-02-14 DIAGNOSIS — S0182XA Laceration with foreign body of other part of head, initial encounter: Secondary | ICD-10-CM

## 2017-02-14 LAB — BASIC METABOLIC PANEL
Anion gap: 8 (ref 5–15)
BUN: 37 mg/dL — ABNORMAL HIGH (ref 6–20)
CALCIUM: 8.1 mg/dL — AB (ref 8.9–10.3)
CO2: 31 mmol/L (ref 22–32)
CREATININE: 1.51 mg/dL — AB (ref 0.61–1.24)
Chloride: 97 mmol/L — ABNORMAL LOW (ref 101–111)
GFR calc non Af Amer: 43 mL/min — ABNORMAL LOW (ref >60)
GFR, EST AFRICAN AMERICAN: 50 mL/min — AB (ref >60)
Glucose, Bld: 327 mg/dL — ABNORMAL HIGH (ref 65–99)
Potassium: 3.7 mmol/L (ref 3.5–5.1)
SODIUM: 136 mmol/L (ref 135–145)

## 2017-02-14 LAB — GLUCOSE, CAPILLARY
GLUCOSE-CAPILLARY: 464 mg/dL — AB (ref 65–99)
GLUCOSE-CAPILLARY: 220 mg/dL — AB (ref 65–99)
GLUCOSE-CAPILLARY: 202 mg/dL — AB (ref 65–99)
Glucose-Capillary: 388 mg/dL — ABNORMAL HIGH (ref 65–99)

## 2017-02-14 LAB — CBC
HCT: 44.3 % (ref 39.0–52.0)
Hemoglobin: 14.7 g/dL (ref 13.0–17.0)
MCH: 31.5 pg (ref 26.0–34.0)
MCHC: 33.2 g/dL (ref 30.0–36.0)
MCV: 94.9 fL (ref 78.0–100.0)
PLATELETS: 136 10*3/uL — AB (ref 150–400)
RBC: 4.67 MIL/uL (ref 4.22–5.81)
RDW: 13 % (ref 11.5–15.5)
WBC: 10.9 10*3/uL — AB (ref 4.0–10.5)

## 2017-02-14 MED ORDER — PREDNISONE 10 MG (21) PO TBPK: ORAL_TABLET | ORAL | 0 refills | Status: DC

## 2017-02-14 MED ORDER — MUPIROCIN 2 % EX OINT
TOPICAL_OINTMENT | Freq: Two times a day (BID) | CUTANEOUS | Status: DC
  Administered 2017-02-14: 1 via TOPICAL
  Filled 2017-02-14: qty 22

## 2017-02-14 MED ORDER — LIDOCAINE HCL (PF) 2 % IJ SOLN
0.0000 mL | Freq: Once | INTRAMUSCULAR | Status: DC | PRN
  Filled 2017-02-14 (×2): qty 20

## 2017-02-14 MED ORDER — LIDOCAINE HCL (PF) 1 % IJ SOLN
INTRAMUSCULAR | Status: AC
  Filled 2017-02-14: qty 5

## 2017-02-14 NOTE — Progress Notes (Signed)
PT taken to [employer] entrance via wheelchair to meet taxi.

## 2017-02-14 NOTE — Progress Notes (Signed)
PT bed alarm now on. Floor mats placed at bedside.

## 2017-02-14 NOTE — Progress Notes (Signed)
Patient ID: Austin Stephenson, male   DOB: [birth year], 76 y.o.   MRN: [id number]  PROGRESS NOTE    Austin Stephenson  XLK:[id number] DOB: 11-Jan-1941 DOA: 02/10/2017  PCP: System, Pcp Not In   Brief Narrative:  75 hypertension,  DM,  bipolar disorder,  CAD s/p CAB at [facility] obacco abuse,  chronic diastolic CHF,  CKD stage 3    ED with shortness of breath and cough for 10 days.   He was dx with pneumonia early in the course of his illness and completed abx therapy but without significant improvement  PT apparently ran out of lasix about 1 month prior to the admission. No fever or chills. Pt was hypoxic on admission with O2 sats 90% on 4 L [place].  EKG showed RBBB and sinus rhythm, LAFB. CXR showed mild interstitial edema. Troponin was WNL.  He was given IV solumedrol on admission and IV lasix for COPD and acute CHF respectively.    Assessment & Plan:   Principal Problem:  Laceration to right forehead above right eyebrow -Bleeding has slowed down -This happened when the patient coughed and might of vasovagal, would still restart telemetry Doctor [facility] was see the patient in consult with regards to repair  Acute respiratory failure with hypoxia / Acute COPD exacerbation - Hypoxia likely secondary to combination of COPD and CHF - Continue duoneb every 8 hours scheduled and as needed for shortness of breath or wheezing  - Stop steroids IV and use PO prednisone from tomorrow (order placed) - Continue oxygen support via [place] to keep O2 sats above 90%  Active Problems:   Essential hypertension - Continue losartan and metoprolol     DM (diabetes mellitus), type 2, uncontrolled, with renal complications (HCC) - Also hyperglycemia secondary to steroids - Switching to po prednisone tomorrow, stopped IV steroids today - Changed lantus to 50 units at bedtime - Continue novolog 15 units TID - Continue SSI - Continue gabapentin for diabetic neuropathy     CKD (chronic  kidney disease) stage 3, GFR 30-59 ml/min - Cr 1.75 this am--->1.5 currently - Monitor daily BMP as pt on IV Lasix     Acute on chronic diastolic CHF (congestive heart failure) (HCC) - Continue daily lasix  - Weight pending this am - Weight 9/23 93.5 kg - Continue strict intake and output     Coronary artery disease - Continue aspirin     BPH - Continue Flomax    Dyslipidemia associated with type 2 diabetes mellitus - Continue Lipitor    Tobacco use - Nicotine patch ordered - Counseled on cessation   DVT prophylaxis: SCD's Code Status: full code  Family Communication: no family at bedside Disposition Plan: possible d/c in 1-2 days, still wheezing    Consultants:   ENT  Procedures:   None  Antimicrobials:   None   Subjective: Wants to go home. Keeps perseverating regarding his pets. Fell this morning after ambulating with therapy-was coughing at the time was not on telemetry   Objective: Vitals:   02/13/17 1300 02/13/17 2009 02/13/17 2127 02/14/17 0552  BP:  139/62  (!) 116/46  Pulse:  77  77  Resp:  18  20  Temp:  97.8 F (36.6 C)  97.6 F (36.4 C)  TempSrc:  Oral  Oral  SpO2: 95% 93% 94% 94%  Weight:    93.5 kg (206 lb 3.2 oz)  Height:        Intake/Output Summary (Last 24 hours) at 02/14/17 3664 Last  data filed at 02/14/17 0727  Gross per 24 hour  Intake             1200 ml  Output             1120 ml  Net               80 ml   Filed Weights   02/12/17 0552 02/13/17 0354 02/14/17 0552  Weight: 93.5 kg (206 lb 1.6 oz) 93.7 kg (206 lb 9.6 oz) 93.5 kg (206 lb 3.2 oz)    Examination:  Awake alert 5 X1 centimeter gash to right forehead Extraocular movements intact Patient alert and oriented S1-S2 no murmur rub or gallop, Abdomen soft nontender no rebound no guarding No lower extremity edema  Data Reviewed: I have personally reviewed following labs and imaging studies  CBC:  Recent Labs Lab 02/10/17 1700 02/11/17 0515  02/12/17 0718 02/14/17 0524  WBC 7.4 11.0* 14.2* 10.9*  NEUTROABS 5.6  --   --   --   HGB 14.8 15.9 14.8 14.7  HCT 44.7 47.5 44.4 44.3  MCV 95.1 96.0 95.9 94.9  PLT 161 154 142* 397*   Basic Metabolic Panel:  Recent Labs Lab 02/10/17 1700 02/11/17 0515 02/12/17 0718 02/13/17 0431 02/14/17 0524  NA 142 135 132* 136 136  K 3.7 4.6 5.1 5.0 3.7  CL 106 95* 97* 98* 97*  CO2 28 28 24 31 31   GLUCOSE 113* 399* 424* 489* 327*  BUN 18 25* 39* 43* 37*  CREATININE 1.47* 1.82* 1.66* 1.75* 1.51*  CALCIUM 8.8* 9.0 8.7* 8.5* 8.1*   GFR: Estimated Creatinine Clearance: 47.8 mL/min (A) (by C-G formula based on SCr of 1.51 mg/dL (H)). Liver Function Tests:  Recent Labs Lab 02/10/17 1700  AST 22  ALT 19  ALKPHOS 109  BILITOT 0.8  PROT 6.2*  ALBUMIN 3.6   No results for input(s): LIPASE, AMYLASE in the last 168 hours. No results for input(s): AMMONIA in the last 168 hours. Coagulation Profile: No results for input(s): INR, PROTIME in the last 168 hours. Cardiac Enzymes:  Recent Labs Lab 02/10/17 2319 02/11/17 0515  TROPONINI 0.04* 0.03*   BNP (last 3 results) No results for input(s): PROBNP in the last 8760 hours. HbA1C: No results for input(s): HGBA1C in the last 72 hours. CBG:  Recent Labs Lab 02/12/17 1630 02/12/17 2059 02/13/17 0738 02/13/17 1647 02/14/17 0750  GLUCAP 433* 472* 428* 448* 220*   Lipid Profile: No results for input(s): CHOL, HDL, LDLCALC, TRIG, CHOLHDL, LDLDIRECT in the last 72 hours. Thyroid Function Tests: No results for input(s): TSH, T4TOTAL, FREET4, T3FREE, THYROIDAB in the last 72 hours. Anemia Panel: No results for input(s): VITAMINB12, FOLATE, FERRITIN, TIBC, IRON, RETICCTPCT in the last 72 hours. Urine analysis:    Component Value Date/Time   COLORURINE STRAW (A) 02/11/2017 0222   APPEARANCEUR CLEAR 02/11/2017 0222   LABSPEC 1.012 02/11/2017 0222   PHURINE 5.0 02/11/2017 0222   GLUCOSEU >=500 (A) 02/11/2017 0222   HGBUR SMALL  (A) 02/11/2017 0222   BILIRUBINUR NEGATIVE 02/11/2017 0222   KETONESUR NEGATIVE 02/11/2017 0222   PROTEINUR NEGATIVE 02/11/2017 0222   NITRITE NEGATIVE 02/11/2017 0222   LEUKOCYTESUR NEGATIVE 02/11/2017 0222   Sepsis Labs: @LABRCNTIP (procalcitonin:4,lacticidven:4)   )No results found for this or any previous visit (from the past 240 hour(s)).    Radiology Studies: Dg Chest Portable 1 View  Result Date: 02/10/2017 CLINICAL DATA:  Shortness of breath for several days. Left chest pain. EXAM: PORTABLE CHEST  1 VIEW COMPARISON:  12/22/2014. FINDINGS: Stable enlarged cardiac silhouette and post CABG changes. Mild increase in prominence of the interstitial markings. No pleural fluid seen. Thoracic spine and right shoulder degenerative changes. IMPRESSION: 1. Interval mild interstitial pulmonary edema. 2. Stable cardiomegaly. Electronically Signed   By: Claudie Revering M.D.   On: 02/10/2017 17:14        Scheduled Meds: . aspirin EC  81 mg Oral Daily  . atorvastatin  40 mg Oral q1800  . furosemide  40 mg Intravenous Daily  . gabapentin  300 mg Oral TID  . insulin aspart  0-5 Units Subcutaneous QHS  . insulin aspart  0-9 Units Subcutaneous TID WC  . insulin aspart  15 Units Subcutaneous TID WC  . insulin aspart  8 Units Subcutaneous Once  . insulin glargine  50 Units Subcutaneous QHS  . ipratropium-albuterol  3 mL Nebulization TID  . losartan  25 mg Oral Daily  . metoprolol succinate  50 mg Oral Daily  . nicotine  21 mg Transdermal Daily  . pantoprazole  40 mg Oral Daily  . predniSONE  50 mg Oral Q breakfast  . tamsulosin  0.8 mg Oral QPC breakfast   Continuous Infusions:   LOS: 4 days    Time spent: 25 minutes  Greater than 50% of the time spent on counseling and coordinating the care.  Verneita Griffes, MD [facility] [phone]  02/14/2017, 8:12 AM

## 2017-02-14 NOTE — Progress Notes (Addendum)
Patient is to go home on home Oxygen; insurance provider is the [place]; CM talked to the patient at the bedside, he goes to the [place] [phone]); CM talked to [employer] SW; she stated that the oxygen can be delivered to his home but someone from the home will have to bring a portable oxygen tank to the hospital, they do not deliver oxygen to the hospital. Patient lives alone; he call a neighbor who will accept his oxygen but he does not have a car. CM talked to [facility] about [place], she believes that the patient does not have out of network benefits. Orders and clinical information faxed to [phone] as requested; B Pennie Rushing [phone]   3:00 pm- patient is determined to leave the hospital without oxygen, stated "Im ready to go home now." CM called his neighbor Madilyn Fireman [phone]) he stated that he will accept the oxygen until the patient is discharged home. Anderson Malta SW at the [facility] paged; also orders for [facility] faxed. Mindi Slicker [facility] [phone]  3:45 pm- CM talked to patient again, he is refusing to stay (for someone to bring a portable oxygen tank to the hospital); He is ambulating the hallway without oxygen at this time; CM talked to [place] with the [facility] in [place] will coordinate a delivery time between the 02 company [employer] [phone]) and the patient for home delivery. Patient is requesting a taxi home; Kathlee Nations with SW called and made aware. Mindi Slicker RN,MHA,BSN

## 2017-02-14 NOTE — Plan of Care (Signed)
Problem: Safety: Goal: Ability to remain free from injury will improve Outcome: Not Progressing Pt non-compliant with high-fall risk bundle. Pt fell today.

## 2017-02-14 NOTE — Progress Notes (Addendum)
Pt in high low bed refusing bed alarm and floor mats. Pt has been educated and still refuses.

## 2017-02-14 NOTE — Progress Notes (Addendum)
RN called to room for pt fall. PT found on floor with gash in R forehead. Pt has been refusing bed alarm entire admission.  PT states that he was coughing and blacked out then woke up on floor. Per NT, pt was sitting on side of bed prior to fall.  MD at bedside assessing pt.

## 2017-02-14 NOTE — Progress Notes (Signed)
Inpatient Diabetes Program Recommendations  AACE/ADA: New Consensus Statement on Inpatient Glycemic Control (2015)  Target Ranges:  Prepandial:   less than 140 mg/dL      Peak postprandial:   less than 180 mg/dL (1-2 hours)      Critically ill patients:  140 - 180 mg/dL   Results for YOSSI, HINCHMAN (MRN [id number]) as of 02/14/2017 08:37  Ref. Range 02/13/2017 07:38 02/13/2017 16:47 02/14/2017 07:50  Glucose-Capillary Latest Ref Range: 65 - 99 mg/dL 428 (H) 448 (H) 220 (H)  Results for LASHAUN, KRAPF (MRN [id number]) as of 02/14/2017 08:37  Ref. Range 02/12/2017 07:18 02/13/2017 04:31 02/14/2017 05:24  Glucose Latest Ref Range: 65 - 99 mg/dL 424 (H) 489 (H) 327 (H)   Review of Glycemic Control  Diabetes history: DM2 Outpatient Diabetes medications: Lantus 40 units QHS, Glipizide 2.5 mg BID, Novolog 10 units TID with meals  Current orders for Inpatient glycemic control: Lantus 50 units QHS, Novolog 0-9 units TID with meals, Novolog 0-5 units QHS, Novolog 15 units TID with meals for meal coverage   Inpatient Diabetes Program Recommendations: Correction (SSI): Please consider increasing Novolog correction scale to Novolog 0-20 units TID with meals. Insulin - Meal Coverage: Noted steroids decreased and changed to PO Prednisone 50 mg QAM. If steroids are continued as ordered, please consider increasing meal coverage to Novolog 20 units TID with meals. HgbA1C: Please consider ordering an A1C to evaluate glycemic control over the past 2-3 months. Diet: Please discontinue Regular diet and order Carb Modified diet.  Thanks, Barnie Alderman, RN, MSN, CDE Diabetes Coordinator Inpatient Diabetes Program [phone] (Team Pager from 8am to 5pm)

## 2017-02-14 NOTE — Progress Notes (Signed)
Pt walked entire length of hallway steady without walker. Pt walked on 2L of O2. Paged MD to make him aware.

## 2017-02-14 NOTE — Consult Note (Signed)
Reason for Consult: Right forehead laceration Referring Physician: Rhetta Mura, MD  HPI:  Austin Stephenson is an 76 y.o. male who was admitted for acute COPD exacerbation and CHF. He had a syncope episode this morning after a coughing spell, resulting in a fall and hitting his head on the ground. He was noted to have a large right forehead laceration. ENT consulted for evaluation and treatment.  Past Medical History:  Diagnosis Date  . Arthritis   . Back pain   . Bipolar disorder (HCC)   . CHF (congestive heart failure) (HCC)    takes Furosemide daily  . Chronic bronchitis (HCC)    "get it just about q yr" (11/26/2014)  . COPD (chronic obstructive pulmonary disease) (HCC)    Duoneb daily as needed.Inhaler daily  . Coronary artery disease    takes Plavix daily  . Cough    Tessalon daily and Mucinex daily as needed.Finishing up ZPAk today and Prednisone on 06/17/15  . Cough   . Dizziness   . Emphysema (subcutaneous) (surgical) resulting from a procedure   . Enlarged prostate    takes FLomax daily  . GERD (gastroesophageal reflux disease)    takes Protonix daily  . History of colon polyps    benign  . History of MRSA infection 2013  . History of shingles   . Hyperlipidemia    takes Simvastatin daily  . Hypertension    takes Metoprolol daily as well as Lisinopril  . Myocardial infarction (HCC) 2014  . Nocturia   . Peripheral edema   . Pneumonia    Nov 2016  . Shortness of breath dyspnea    with exertion  . Type II diabetes mellitus (HCC)    takes Novolog and Lantus daily.Average fasting blood sugar runs 120-130    Past Surgical History:  Procedure Laterality Date  . COLONOSCOPY    . CORONARY ARTERY BYPASS GRAFT  2014   at [facility]; "CABG X3"  . ESOPHAGOGASTRODUODENOSCOPY N/A 12/21/2014   Procedure: ESOPHAGOGASTRODUODENOSCOPY (EGD);  Surgeon: Meryl Dare, MD;  Location: [facility] ENDOSCOPY;  Service: Endoscopy;  Laterality: N/A;  . INGUINAL HERNIA REPAIR Left    with  mesh  . KNEE SURGERY Right   . MANDIBLE FRACTURE SURGERY  70's  . TONSILLECTOMY      Family History  Problem Relation Age of Onset  . Diabetes Father     Social History:  reports that he has been smoking Cigarettes.  He has a 60.00 pack-year smoking history. He has never used smokeless tobacco. He reports that he uses drugs. He reports that he does not drink alcohol.  Allergies: No Known Allergies  Prior to Admission medications   Medication Sig Start Date End Date Taking? Authorizing Provider  aspirin 81 MG EC tablet Take 1 tablet (81 mg total) by mouth daily after breakfast. Patient taking differently: Take 81 mg by mouth daily.  12/22/14  Yes Casey Burkitt, MD  furosemide (LASIX) 40 MG tablet Take 40 mg by mouth daily.   Yes [provider]  gabapentin (NEURONTIN) 300 MG capsule Take 300 mg by mouth 3 (three) times daily.   Yes [provider]  glipiZIDE (GLUCOTROL) 5 MG tablet Take 2.5 mg by mouth 2 (two) times daily before a meal.   Yes [provider]  glucose 4 GM chewable tablet Chew 1 tablet by mouth See admin instructions. Chew 1-4 tablet (4 -16 gm) by mouth<70 until blood sugar is increased and dizziness stops   Yes [provider]  insulin aspart (NOVOLOG) 100 UNIT/ML injection Inject 5 Units into the skin 3 (three) times daily after meals. Patient taking differently: Inject 10 Units into the skin 3 (three) times daily after meals.  12/22/14  Yes Rogue Bussing, MD  insulin glargine (LANTUS) 100 UNIT/ML injection Inject 0.2 mLs (20 Units total) into the skin at bedtime. Patient taking differently: Inject 40 Units into the skin at bedtime.  12/22/14  Yes Fitzgerald, Sharman Cheek, MD  ipratropium-albuterol (DUONEB) 0.5-2.5 (3) MG/3ML SOLN Take 3 mLs by nebulization every 6 (six) hours as needed. 12/22/14  Yes Rogue Bussing, MD  losartan (COZAAR) 50 MG tablet Take 25 mg by mouth daily.   Yes [provider]   metoprolol succinate (TOPROL-XL) 100 MG 24 hr tablet Take 50 mg by mouth daily. Take with or immediately following a meal.   Yes [provider]  PRESCRIPTION MEDICATION Take 1 each by nebulization 4 (four) times daily as needed (shortness of breath/wheezing/cough).   Yes [provider]  PRESCRIPTION MEDICATION Inject 25 Units into the skin at bedtime. Long acting insulin   Yes [provider]  PRESCRIPTION MEDICATION Inject 15-20 Units into the skin See admin instructions. Short acting insulin - inject 15 units subcutaneously with breakfast & 20 units with lunch and supper   Yes [provider]  simvastatin (ZOCOR) 80 MG tablet Take 0.5 tablets (40 mg total) by mouth daily. Patient taking differently: Take 40 mg by mouth at bedtime.  12/22/14  Yes Rogue Bussing, MD  tamsulosin (FLOMAX) 0.4 MG CAPS capsule Take 0.8 mg by mouth daily.    Yes [provider]  traMADol (ULTRAM) 50 MG tablet Take 50 mg by mouth every 8 (eight) hours as needed (pain).   Yes [provider]  budesonide-formoterol (SYMBICORT) 160-4.5 MCG/ACT inhaler Inhale 2 puffs into the lungs 2 (two) times daily.    [provider]  metoprolol tartrate (LOPRESSOR) 50 MG tablet Take 0.5 tablets (25 mg total) by mouth 2 (two) times daily. Patient not taking: Reported on 02/10/2017 12/22/14   Rogue Bussing, MD  QUEtiapine (SEROQUEL) 100 MG tablet Take 2 tablets (200 mg total) by mouth at bedtime. Patient not taking: Reported on 02/10/2017 12/22/14   Rogue Bussing, MD    Medications:  I have reviewed the patient's current medications. Scheduled: . aspirin EC  81 mg Oral Daily  . atorvastatin  40 mg Oral q1800  . furosemide  40 mg Intravenous Daily  . gabapentin  300 mg Oral TID  . insulin aspart  0-5 Units Subcutaneous QHS  . insulin aspart  0-9 Units Subcutaneous TID WC  . insulin aspart  15 Units Subcutaneous TID WC  . insulin aspart  8  Units Subcutaneous Once  . insulin glargine  50 Units Subcutaneous QHS  . ipratropium-albuterol  3 mL Nebulization TID  . losartan  25 mg Oral Daily  . metoprolol succinate  50 mg Oral Daily  . mupirocin ointment   Topical BID  . nicotine  21 mg Transdermal Daily  . pantoprazole  40 mg Oral Daily  . predniSONE  50 mg Oral Q breakfast  . tamsulosin  0.8 mg Oral QPC breakfast   Continuous:  JSH:FWYOVZCHYIFOY, ALPRAZolam, ipratropium-albuterol, lidocaine, ondansetron **OR** ondansetron (ZOFRAN) IV, traMADol, zolpidem  Results for orders placed or performed during the hospital encounter of 02/10/17 (from the past 48 hour(s))  Glucose, capillary     Status: Abnormal   Collection Time: 02/12/17 12:04 PM  Result Value  Ref Range   Glucose-Capillary 463 (H) 65 - 99 mg/dL   Comment 1 Notify RN    Comment 2 Document in Chart   Glucose, capillary     Status: Abnormal   Collection Time: 02/12/17  4:30 PM  Result Value Ref Range   Glucose-Capillary 433 (H) 65 - 99 mg/dL   Comment 1 Notify RN    Comment 2 Document in Chart   Glucose, capillary     Status: Abnormal   Collection Time: 02/12/17  8:59 PM  Result Value Ref Range   Glucose-Capillary 472 (H) 65 - 99 mg/dL  Basic metabolic panel     Status: Abnormal   Collection Time: 02/13/17  4:31 AM  Result Value Ref Range   Sodium 136 135 - 145 mmol/L   Potassium 5.0 3.5 - 5.1 mmol/L   Chloride 98 (L) 101 - 111 mmol/L   CO2 31 22 - 32 mmol/L   Glucose, Bld 489 (H) 65 - 99 mg/dL   BUN 43 (H) 6 - 20 mg/dL   Creatinine, Ser 1.75 (H) 0.61 - 1.24 mg/dL   Calcium 8.5 (L) 8.9 - 10.3 mg/dL   GFR calc non Af Amer 36 (L) >60 mL/min   GFR calc Af Amer 42 (L) >60 mL/min    Comment: (NOTE) The eGFR has been calculated using the CKD EPI equation. This calculation has not been validated in all clinical situations. eGFR's persistently <60 mL/min signify possible Chronic Kidney Disease.    Anion gap 7 5 - 15  Glucose, capillary     Status: Abnormal    Collection Time: 02/13/17  7:38 AM  Result Value Ref Range   Glucose-Capillary 428 (H) 65 - 99 mg/dL   Comment 1 Notify RN   Glucose, capillary     Status: Abnormal   Collection Time: 02/13/17 11:15 AM  Result Value Ref Range   Glucose-Capillary 464 (H) 65 - 99 mg/dL   Comment 1 Notify RN   Glucose, capillary     Status: Abnormal   Collection Time: 02/13/17  4:47 PM  Result Value Ref Range   Glucose-Capillary 448 (H) 65 - 99 mg/dL   Comment 1 Notify RN   Glucose, capillary     Status: Abnormal   Collection Time: 02/13/17  9:09 PM  Result Value Ref Range   Glucose-Capillary 388 (H) 65 - 99 mg/dL   Comment 1 Notify RN    Comment 2 Document in Chart   Basic metabolic panel     Status: Abnormal   Collection Time: 02/14/17  5:24 AM  Result Value Ref Range   Sodium 136 135 - 145 mmol/L   Potassium 3.7 3.5 - 5.1 mmol/L    Comment: DELTA CHECK NOTED   Chloride 97 (L) 101 - 111 mmol/L   CO2 31 22 - 32 mmol/L   Glucose, Bld 327 (H) 65 - 99 mg/dL   BUN 37 (H) 6 - 20 mg/dL   Creatinine, Ser 1.51 (H) 0.61 - 1.24 mg/dL   Calcium 8.1 (L) 8.9 - 10.3 mg/dL   GFR calc non Af Amer 43 (L) >60 mL/min   GFR calc Af Amer 50 (L) >60 mL/min    Comment: (NOTE) The eGFR has been calculated using the CKD EPI equation. This calculation has not been validated in all clinical situations. eGFR's persistently <60 mL/min signify possible Chronic Kidney Disease.    Anion gap 8 5 - 15  CBC     Status: Abnormal   Collection Time: 02/14/17  5:24 AM  Result Value Ref Range   WBC 10.9 (H) 4.0 - 10.5 K/uL   RBC 4.67 4.22 - 5.81 MIL/uL   Hemoglobin 14.7 13.0 - 17.0 g/dL   HCT 44.3 39.0 - 52.0 %   MCV 94.9 78.0 - 100.0 fL   MCH 31.5 26.0 - 34.0 pg   MCHC 33.2 30.0 - 36.0 g/dL   RDW 13.0 11.5 - 15.5 %   Platelets 136 (L) 150 - 400 K/uL  Glucose, capillary     Status: Abnormal   Collection Time: 02/14/17  7:50 AM  Result Value Ref Range   Glucose-Capillary 220 (H) 65 - 99 mg/dL   Comment 1 Notify RN    Glucose, capillary     Status: Abnormal   Collection Time: 02/14/17 11:18 AM  Result Value Ref Range   Glucose-Capillary 202 (H) 65 - 99 mg/dL   Comment 1 Notify RN     Review of Systems:  All other systems reviewed and apart from HPI, are negative.  Blood pressure (!) 155/57, pulse 77, temperature 97.6 F (36.4 C), temperature source Oral, resp. rate 18, height _0  (1.778 m), weight 93.5 kg (206 lb 3.2 oz), SpO2 94 %. Physical Exam: Constitutional: Appears chronically-ill. No pallor, no diaphoresis.  Head: A 3cm right forehead full thickness laceration. No active bleeding. Eyes: PERRL, lids and conjunctivae normal Ears: Normal auricles and EACs. Nose: Normal mucosa, septum, and turbinates. Mouth: Normal mucosa. No lesion or injury.  Neck: normal, supple, no masses, no thyromegaly. Respiratory: Inspiratory and expiratory wheezes. Increased WOB. No pallor or cyanosis. .  Musculoskeletal: no clubbing / cyanosis. No joint deformity upper and lower extremities.    Skin: no significant rashes, lesions, ulcers. Warm, dry, well-perfused. Neurologic: CN 2-12 grossly intact. Sensation intact. Strength 5/5 in all 4 limbs.  Psychiatric: Normal judgment and insight. Alert and oriented x 3. Normal mood and affect.   Procedure: Intermediate repair of 3cm right forehead laceration Anesthesia: Local anesthesia Description: The patient is placed supine on the operating table. The right is prepped and draped in a sterile fashion.  After adequate local anesthesia is achieved, the laceration site is carefully debrided.  Soft tissue undermining is performed to release the skin tension. The laceration is closed in layers with interrupted sutures.  The patient tolerated the procedure well.    Assessment/Plan: Right forehead laceration. Repaired with interrupted sutures. Apply antibiotic ointment TID for 3 days. The patient may follow up with me in 1 week for suture removal. Pt may call my office at  [phone] for appt.  Austin Stephenson 02/14/2017, 11:45 AM

## 2017-02-14 NOTE — Progress Notes (Signed)
Patient's wound dressed with pressure dressing and steri strips awaiting surgeon for sutures. Patient informed that bed alarm and safety mat will be in used. Patient still refuses, but due to recent incident all safety measures are being taken.

## 2017-02-14 NOTE — Progress Notes (Signed)
Attempted to cover discharge instructions with pt. Pt did not care to listen and stated he knew what to do when he got home. Pt ripped out IV while RN was trying to cover discharge instructions, tele box had already been removed by pt. Pt waiting on taxi voucher from social work and refuses to stay and wait for oxygen to be delivered.

## 2017-02-14 NOTE — Progress Notes (Signed)
Physical Therapy Treatment Patient Details Name: Austin Stephenson MRN: [id number] DOB: 1941/03/12 Today's Date: 02/14/2017    History of Present Illness Austin Stephenson is a 76 y.o. male with medical history significant for hypertension, type 2 diabetes mellitus, bipolar disorder, coronary artery disease status post CABG, tobacco abuse, chronic diastolic CHF, and chronic kidney disease stage III, now presenting to the emergency department with dyspnea and cough for the past 10 days.     PT Comments    Patient continues to be unsteady with gait and encouraged to use AD however pt refused. Pt with SpO2 desat to 84% on RA with ambulation and required supplemental O2. Current plan remains appropriate.    Follow Up Recommendations  Home health PT     Equipment Recommendations  Rolling walker with 5" wheels    Recommendations for Other Services       Precautions / Restrictions Precautions Precautions: Fall Precaution Comments: Pt with history of passing out when he does not have O2 on and he coughs. He fell off EOB 9/22 Restrictions Weight Bearing Restrictions: No    Mobility  Bed Mobility Overal bed mobility: Modified Independent             General bed mobility comments: increased effort  Transfers Overall transfer level: Needs assistance Equipment used: None Transfers: Sit to/from Stand Sit to Stand: Supervision         General transfer comment: supervision for safety  Ambulation/Gait Ambulation/Gait assistance: Min guard;Min assist Ambulation Distance (Feet): 250 Feet Assistive device: None Gait Pattern/deviations: Step-through pattern;Decreased stride length;Staggering right;Staggering left;Trunk flexed     General Gait Details: cues for cadence, safety, and breathing technique; pt encouraged to use AD however pt refused; pt is unsteady with gait and required min A at times or single UE support    Stairs            Wheelchair Mobility     Modified Rankin (Stroke Patients Only)       Balance Overall balance assessment: Needs assistance Sitting-balance support: No upper extremity supported Sitting balance-Leahy Scale: Good     Standing balance support: No upper extremity supported Standing balance-Leahy Scale: Poor Standing balance comment: Able to walk but was unstable                            Cognition Arousal/Alertness: Awake/alert Behavior During Therapy: Impulsive Overall Cognitive Status: Within Functional Limits for tasks assessed                                        Exercises      General Comments General comments (skin integrity, edema, etc.): SpO2 desat to 84% on RA with ambulation      Pertinent Vitals/Pain Pain Assessment: Faces Faces Pain Scale: Hurts little more 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   Pain Descriptors / Indicators: Burning Pain Intervention(s): Limited activity within patient's tolerance;Monitored during session;Repositioned    Home Living                      Prior Function            PT Goals (current goals can now be found in the care plan section) Acute Rehab PT Goals PT Goal Formulation: With patient Time For Goal Achievement: 02/25/17 Potential to Achieve Goals: Good Progress towards PT goals: Progressing toward goals  Frequency    Min 3X/week      PT Plan Current plan remains appropriate    Co-evaluation              AM-PAC PT "6 Clicks" Daily Activity  Outcome Measure  Difficulty turning over in bed (including adjusting bedclothes, sheets and blankets)?: None Difficulty moving from lying on back to sitting on the side of the bed? : None Difficulty sitting down on and standing up from a chair with arms (e.g., wheelchair, bedside commode, etc,.)?: A Little Help needed moving to and from a bed to chair (including a wheelchair)?: A Little Help needed walking in hospital room?: A Little Help needed  climbing 3-5 steps with a railing? : A Little 6 Click Score: 20    End of Session Equipment Utilized During Treatment: Gait belt Activity Tolerance: Patient tolerated treatment well Patient left: in bed;with call bell/phone within reach Nurse Communication: Mobility status PT Visit Diagnosis: Unsteadiness on feet (R26.81);Other abnormalities of gait and mobility (R26.89)     Time: 5686-1683 PT Time Calculation (min) (ACUTE ONLY): 22 min  Charges:  $Gait Training: 8-22 mins                    G Codes:       Earney Navy, PTA Pager: [phone]     Darliss Cheney 02/14/2017, 9:52 AM

## 2017-02-14 NOTE — Progress Notes (Signed)
CSW alerted by [facility] that patient needs taxi voucher home. CSW provided taxi voucher for patient.  CSW signing off.  Laveda Abbe, [place] Clinical Social Worker [phone]

## 2017-02-14 NOTE — Progress Notes (Addendum)
SATURATION QUALIFICATIONS: (This note is used to comply with regulatory documentation for home oxygen)  Patient Saturations on Room Air at Rest = 92%  Patient Saturations on Room Air while Ambulating = 84%  Patient Saturations on 2 Liters of oxygen while Ambulating = 88%  Please briefly explain why patient needs home oxygen:Patient is unable to maintain O2 sats 90% or > on RA when mobilizing.  Earney Navy, PTA Pager: [phone]

## 2017-02-15 NOTE — Discharge Summary (Signed)
Physician Discharge Summary  Austin Stephenson VVO:[id number] DOB: [birth year] DOA: 02/10/2017  PCP: System, Pcp Not In  Admit date: 02/10/2017 Discharge date: 02/15/2017  Time spent: 30 minutes  Recommendations for Outpatient Follow-up:  1. Needs outpatient follow-up with ENT 2. Needs suture removal 3. Follow-up with cardiology and get labs in about one week  Discharge Diagnoses:  Principal Problem:   COPD exacerbation ([place]) Active Problems:   Essential hypertension   DM (diabetes mellitus), type 2, uncontrolled, with renal complications (HCC)   CKD (chronic kidney disease) stage 3, GFR 30-59 ml/min   Bipolar disorder (HCC)   Acute on chronic diastolic CHF (congestive heart failure) ([place])   Coronary artery disease   Acute respiratory failure with hypoxia [facility])   Discharge Condition: improved  Diet recommendation: heart healthy diabetic  Filed Weights   02/12/17 0552 02/13/17 0354 02/14/17 0552  Weight: 93.5 kg (206 lb 1.6 oz) 93.7 kg (206 lb 9.6 oz) 93.5 kg (206 lb 3.2 oz)    History of present illness:  75 hypertension,  DM,  bipolar disorder,  CAD s/p CAB at [facility]/ECU obacco abuse,  chronic diastolic CHF,  CKD stage 3    ED with shortness of breath and cough for 10 days.   He was dx with pneumonia early in the course of his illness and completed abx therapy but without significant improvement  PT apparently ran out of lasix about 1 month prior to the admission. No fever or chills. Pt was hypoxic on admission with O2 sats 90% on 4 L [place].  EKG showed RBBB and sinus rhythm, LAFB. CXR showed mild interstitial edema. Troponin was WNL.  He was given IV solumedrol on admission and IV lasix for COPD and acute CHF respectively.    Hospital Course:   Laceration to right forehead above right eyebrow -Bleeding has slowed down -This happened when the patient coughed and might of vasovagal, would still restart telemetry Doctor [facility] of ENT was see the patient in  consult with regards to repair  Patient was counseled after repair to follow-up with ENT  Acute respiratory failure with hypoxia / Acute COPD exacerbation - Hypoxia likely secondary to combination of COPD and CHF - Continue duoneb every 8 hours scheduled and as needed for shortness of breath or wheezing  - initially was onsteroids IV and use PO prednisoneon discharge was given - Continue oxygen support via [place] to keep O2 sats above 90%  Active Problems: Essential hypertension - Continue losartan and metoprolol   DM (diabetes mellitus), type 2, uncontrolled, with renal complications (HCC) - Also hyperglycemia secondary to steroids - Switching to po prednisone tomorrow, stopped IV steroids today - Changed lantus to 50 units at bedtime - Continue novolog 15 units TID - Continue SSI - Continue gabapentin for diabetic neuropathy   CKD (chronic kidney disease) stage 3, GFR 30-59 ml/min - Cr 1.75 this am--->and this improved during hospital stay  Acute on chronic diastolic CHF (congestive heart failure) (HCC) - Continue daily lasix  - Weight pending this am - Weight 9/23 93.5 kg - Continue strict intake and output   Coronary artery disease - Continue aspirin   BPH - Continue Flomax  Dyslipidemia associated with type 2 diabetes mellitus - Continue Lipitor    Tobacco use - Nicotine patch ordered - Counseled on cessation   Procedures: Suturing of laceration over the rightEyebrow  Consultations:  ENT  Discharge Exam: Vitals:   02/14/17 1038 02/14/17 1341  BP: (!) 155/57   Pulse:    Resp:  18   Temp:    SpO2: 94% 91%    General: alert wasn't oriented insistent on going home Laceration to the right eyebrow seems to be without oozing Cardiovascular: S1-S2 no murmur rub or gallop Respiratory: clinically clear  Discharge Instructions   Discharge Instructions    Diet - low sodium heart healthy    Complete by:  As directed    Diet - low sodium  heart healthy    Complete by:  As directed    Discharge instructions    Complete by:  As directed    I've given new prescription for prednisone  And you need to take this as directed I recommend he stop smokingand I recommended follow up closely with the ENT doctor who did the sutures on her head and get the sutures removed in 10 days If you feel poorly week short of breath or LE need to go see ur primary physician   Increase activity slowly    Complete by:  As directed    Increase activity slowly    Complete by:  As directed      Discharge Medication List as of 02/14/2017  3:46 PM    START taking these medications   Details  predniSONE (STERAPRED UNI-PAK 21 TAB) 10 MG (21) TBPK tablet Take 2 tablets for 7 days and then 1 tablet for 4 days and then stop, Print      CONTINUE these medications which have NOT CHANGED   Details  aspirin 81 MG EC tablet Take 1 tablet (81 mg total) by mouth daily after breakfast., Starting Mon 12/22/2014, Print    furosemide (LASIX) 40 MG tablet Take 40 mg by mouth daily., Historical Med    gabapentin (NEURONTIN) 300 MG capsule Take 300 mg by mouth 3 (three) times daily., Historical Med    glipiZIDE (GLUCOTROL) 5 MG tablet Take 2.5 mg by mouth 2 (two) times daily before a meal., Historical Med    glucose 4 GM chewable tablet Chew 1 tablet by mouth See admin instructions. Chew 1-4 tablet (4 -16 gm) by mouth<70 until blood sugar is increased and dizziness stops, Historical Med    insulin glargine (LANTUS) 100 UNIT/ML injection Inject 0.2 mLs (20 Units total) into the skin at bedtime., Starting Mon 12/22/2014, Print    ipratropium-albuterol (DUONEB) 0.5-2.5 (3) MG/3ML SOLN Take 3 mLs by nebulization every 6 (six) hours as needed., Starting Mon 12/22/2014, Print    losartan (COZAAR) 50 MG tablet Take 25 mg by mouth daily., Historical Med    metoprolol succinate (TOPROL-XL) 100 MG 24 hr tablet Take 50 mg by mouth daily. Take with or immediately following a meal.,  Historical Med    !! PRESCRIPTION MEDICATION Take 1 each by nebulization 4 (four) times daily as needed (shortness of breath/wheezing/cough)., Historical Med    !! PRESCRIPTION MEDICATION Inject 25 Units into the skin at bedtime. Long acting insulin, Historical Med    !! PRESCRIPTION MEDICATION Inject 15-20 Units into the skin See admin instructions. Short acting insulin - inject 15 units subcutaneously with breakfast & 20 units with lunch and supper, Historical Med    simvastatin (ZOCOR) 80 MG tablet Take 0.5 tablets (40 mg total) by mouth daily., Starting Mon 12/22/2014, Print    tamsulosin (FLOMAX) 0.4 MG CAPS capsule Take 0.8 mg by mouth daily. , Historical Med    traMADol (ULTRAM) 50 MG tablet Take 50 mg by mouth every 8 (eight) hours as needed (pain)., Historical Med    budesonide-formoterol (SYMBICORT) 160-4.5 MCG/ACT  inhaler Inhale 2 puffs into the lungs 2 (two) times daily., Historical Med    QUEtiapine (SEROQUEL) 100 MG tablet Take 2 tablets (200 mg total) by mouth at bedtime., Starting Mon 12/22/2014, Print     !! - Potential duplicate medications found. Please discuss with provider.    STOP taking these medications     insulin aspart (NOVOLOG) 100 UNIT/ML injection      metoprolol tartrate (LOPRESSOR) 50 MG tablet        No Known Allergies    The results of significant diagnostics from this hospitalization (including imaging, microbiology, ancillary and laboratory) are listed below for reference.    Significant Diagnostic Studies: Dg Chest Portable 1 View  Result Date: 02/10/2017 CLINICAL DATA:  Shortness of breath for several days. Left chest pain. EXAM: PORTABLE CHEST 1 VIEW COMPARISON:  12/22/2014. FINDINGS: Stable enlarged cardiac silhouette and post CABG changes. Mild increase in prominence of the interstitial markings. No pleural fluid seen. Thoracic spine and right shoulder degenerative changes. IMPRESSION: 1. Interval mild interstitial pulmonary edema. 2. Stable  cardiomegaly. Electronically Signed   By: Claudie Revering M.D.   On: 02/10/2017 17:14    Microbiology: No results found for this or any previous visit (from the past 240 hour(s)).   Labs: Basic Metabolic Panel:  Recent Labs Lab 02/10/17 1700 02/11/17 0515 02/12/17 0718 02/13/17 0431 02/14/17 0524  NA 142 135 132* 136 136  K 3.7 4.6 5.1 5.0 3.7  CL 106 95* 97* 98* 97*  CO2 28 28 24 31 31   GLUCOSE 113* 399* 424* 489* 327*  BUN 18 25* 39* 43* 37*  CREATININE 1.47* 1.82* 1.66* 1.75* 1.51*  CALCIUM 8.8* 9.0 8.7* 8.5* 8.1*   Liver Function Tests:  Recent Labs Lab 02/10/17 1700  AST 22  ALT 19  ALKPHOS 109  BILITOT 0.8  PROT 6.2*  ALBUMIN 3.6   No results for input(s): LIPASE, AMYLASE in the last 168 hours. No results for input(s): AMMONIA in the last 168 hours. CBC:  Recent Labs Lab 02/10/17 1700 02/11/17 0515 02/12/17 0718 02/14/17 0524  WBC 7.4 11.0* 14.2* 10.9*  NEUTROABS 5.6  --   --   --   HGB 14.8 15.9 14.8 14.7  HCT 44.7 47.5 44.4 44.3  MCV 95.1 96.0 95.9 94.9  PLT 161 154 142* 136*   Cardiac Enzymes:  Recent Labs Lab 02/10/17 2319 02/11/17 0515  TROPONINI 0.04* 0.03*   BNP: BNP (last 3 results)  Recent Labs  02/10/17 1646  BNP 377.6*    ProBNP (last 3 results) No results for input(s): PROBNP in the last 8760 hours.  CBG:  Recent Labs Lab 02/13/17 1115 02/13/17 1647 02/13/17 2109 02/14/17 0750 02/14/17 1118  GLUCAP 464* 448* 388* 220* 202*       Signed:  Nita Sells MD   [facility] 02/15/2017, 5:04 PM

## 2017-02-15 NOTE — Progress Notes (Signed)
CM called patient to verify home oxygen delivery;  Patient stated that he has his home oxygen but left his medication that he brought from home at the hospital, along with a cell phone charger. Main pharmacy called by Lucina Mellow, they have his medication; the staff also found his cell phone charger. Patient will come to the hospital to pick up his medication; Aneta Mins [phone]

## 2017-07-22 ENCOUNTER — Inpatient Hospital Stay (HOSPITAL_COMMUNITY)
Admission: EM | Admit: 2017-07-22 | Discharge: 2017-07-26 | DRG: 948 | Disposition: A | Discharge status: Skilled Nursing Facility | Payer: Medicare Other | Attending: Internal Medicine | Admitting: Internal Medicine

## 2017-07-22 ENCOUNTER — Emergency Department (HOSPITAL_COMMUNITY): Payer: Medicare Other

## 2017-07-22 ENCOUNTER — Encounter (HOSPITAL_COMMUNITY): Payer: Self-pay | Admitting: Emergency Medicine

## 2017-07-22 DIAGNOSIS — J449 Chronic obstructive pulmonary disease, unspecified: Secondary | ICD-10-CM | POA: Diagnosis present

## 2017-07-22 DIAGNOSIS — Z515 Encounter for palliative care: Secondary | ICD-10-CM | POA: Diagnosis not present

## 2017-07-22 DIAGNOSIS — E1165 Type 2 diabetes mellitus with hyperglycemia: Secondary | ICD-10-CM | POA: Diagnosis not present

## 2017-07-22 DIAGNOSIS — C61 Malignant neoplasm of prostate: Secondary | ICD-10-CM | POA: Diagnosis present

## 2017-07-22 DIAGNOSIS — I739 Peripheral vascular disease, unspecified: Secondary | ICD-10-CM | POA: Diagnosis present

## 2017-07-22 DIAGNOSIS — I5033 Acute on chronic diastolic (congestive) heart failure: Secondary | ICD-10-CM | POA: Diagnosis not present

## 2017-07-22 DIAGNOSIS — Z951 Presence of aortocoronary bypass graft: Secondary | ICD-10-CM | POA: Diagnosis not present

## 2017-07-22 DIAGNOSIS — N400 Enlarged prostate without lower urinary tract symptoms: Secondary | ICD-10-CM | POA: Diagnosis present

## 2017-07-22 DIAGNOSIS — E1129 Type 2 diabetes mellitus with other diabetic kidney complication: Secondary | ICD-10-CM | POA: Diagnosis present

## 2017-07-22 DIAGNOSIS — Z7982 Long term (current) use of aspirin: Secondary | ICD-10-CM

## 2017-07-22 DIAGNOSIS — M549 Dorsalgia, unspecified: Secondary | ICD-10-CM

## 2017-07-22 DIAGNOSIS — I252 Old myocardial infarction: Secondary | ICD-10-CM | POA: Diagnosis not present

## 2017-07-22 DIAGNOSIS — Z794 Long term (current) use of insulin: Secondary | ICD-10-CM

## 2017-07-22 DIAGNOSIS — K219 Gastro-esophageal reflux disease without esophagitis: Secondary | ICD-10-CM | POA: Diagnosis present

## 2017-07-22 DIAGNOSIS — Z79899 Other long term (current) drug therapy: Secondary | ICD-10-CM | POA: Diagnosis not present

## 2017-07-22 DIAGNOSIS — G893 Neoplasm related pain (acute) (chronic): Secondary | ICD-10-CM

## 2017-07-22 DIAGNOSIS — Z8619 Personal history of other infectious and parasitic diseases: Secondary | ICD-10-CM

## 2017-07-22 DIAGNOSIS — Z66 Do not resuscitate: Secondary | ICD-10-CM | POA: Diagnosis present

## 2017-07-22 DIAGNOSIS — I110 Hypertensive heart disease with heart failure: Secondary | ICD-10-CM | POA: Diagnosis present

## 2017-07-22 DIAGNOSIS — I5032 Chronic diastolic (congestive) heart failure: Secondary | ICD-10-CM | POA: Diagnosis present

## 2017-07-22 DIAGNOSIS — M545 Low back pain: Secondary | ICD-10-CM | POA: Diagnosis present

## 2017-07-22 DIAGNOSIS — C49A9 Gastrointestinal stromal tumor of other sites: Secondary | ICD-10-CM | POA: Diagnosis not present

## 2017-07-22 DIAGNOSIS — F17210 Nicotine dependence, cigarettes, uncomplicated: Secondary | ICD-10-CM | POA: Diagnosis present

## 2017-07-22 DIAGNOSIS — I2510 Atherosclerotic heart disease of native coronary artery without angina pectoris: Secondary | ICD-10-CM | POA: Diagnosis present

## 2017-07-22 DIAGNOSIS — Z7902 Long term (current) use of antithrombotics/antiplatelets: Secondary | ICD-10-CM

## 2017-07-22 DIAGNOSIS — R42 Dizziness and giddiness: Secondary | ICD-10-CM | POA: Diagnosis not present

## 2017-07-22 DIAGNOSIS — Z8614 Personal history of Methicillin resistant Staphylococcus aureus infection: Secondary | ICD-10-CM | POA: Diagnosis not present

## 2017-07-22 DIAGNOSIS — J441 Chronic obstructive pulmonary disease with (acute) exacerbation: Secondary | ICD-10-CM

## 2017-07-22 DIAGNOSIS — F319 Bipolar disorder, unspecified: Secondary | ICD-10-CM | POA: Diagnosis present

## 2017-07-22 DIAGNOSIS — E785 Hyperlipidemia, unspecified: Secondary | ICD-10-CM | POA: Diagnosis present

## 2017-07-22 DIAGNOSIS — E1151 Type 2 diabetes mellitus with diabetic peripheral angiopathy without gangrene: Secondary | ICD-10-CM | POA: Diagnosis present

## 2017-07-22 DIAGNOSIS — G8929 Other chronic pain: Secondary | ICD-10-CM | POA: Diagnosis not present

## 2017-07-22 DIAGNOSIS — IMO0002 Reserved for concepts with insufficient information to code with codable children: Secondary | ICD-10-CM | POA: Diagnosis present

## 2017-07-22 DIAGNOSIS — C7951 Secondary malignant neoplasm of bone: Secondary | ICD-10-CM | POA: Diagnosis present

## 2017-07-22 DIAGNOSIS — E119 Type 2 diabetes mellitus without complications: Secondary | ICD-10-CM | POA: Diagnosis not present

## 2017-07-22 DIAGNOSIS — I509 Heart failure, unspecified: Secondary | ICD-10-CM | POA: Diagnosis not present

## 2017-07-22 HISTORY — DX: Malignant (primary) neoplasm, unspecified: C80.1

## 2017-07-22 LAB — URINALYSIS, ROUTINE W REFLEX MICROSCOPIC
BACTERIA UA: NONE SEEN
BILIRUBIN URINE: NEGATIVE
GLUCOSE, UA: NEGATIVE mg/dL
Ketones, ur: 5 mg/dL — AB
LEUKOCYTES UA: NEGATIVE
Nitrite: NEGATIVE
PROTEIN: 30 mg/dL — AB
SPECIFIC GRAVITY, URINE: 1.011 (ref 1.005–1.030)
Squamous Epithelial / LPF: NONE SEEN
pH: 5 (ref 5.0–8.0)

## 2017-07-22 LAB — CBC WITH DIFFERENTIAL/PLATELET
BASOS PCT: 0 %
Basophils Absolute: 0 10*3/uL (ref 0.0–0.1)
EOS ABS: 0.1 10*3/uL (ref 0.0–0.7)
EOS PCT: 1 %
HCT: 40.7 % (ref 39.0–52.0)
Hemoglobin: 14.6 g/dL (ref 13.0–17.0)
LYMPHS ABS: 0.9 10*3/uL (ref 0.7–4.0)
Lymphocytes Relative: 14 %
MCH: 33 pg (ref 26.0–34.0)
MCHC: 35.9 g/dL (ref 30.0–36.0)
MCV: 92.1 fL (ref 78.0–100.0)
MONO ABS: 0.7 10*3/uL (ref 0.1–1.0)
MONOS PCT: 12 %
Neutro Abs: 4.5 10*3/uL (ref 1.7–7.7)
Neutrophils Relative %: 73 %
PLATELETS: 163 10*3/uL (ref 150–400)
RBC: 4.42 MIL/uL (ref 4.22–5.81)
RDW: 13.2 % (ref 11.5–15.5)
WBC: 6.2 10*3/uL (ref 4.0–10.5)

## 2017-07-22 LAB — COMPREHENSIVE METABOLIC PANEL
ALBUMIN: 3.6 g/dL (ref 3.5–5.0)
ALT: 15 U/L — ABNORMAL LOW (ref 17–63)
ANION GAP: 11 (ref 5–15)
AST: 29 U/L (ref 15–41)
Alkaline Phosphatase: 1047 U/L — ABNORMAL HIGH (ref 38–126)
BUN: 21 mg/dL — ABNORMAL HIGH (ref 6–20)
CALCIUM: 8.7 mg/dL — AB (ref 8.9–10.3)
CO2: 27 mmol/L (ref 22–32)
Chloride: 98 mmol/L — ABNORMAL LOW (ref 101–111)
Creatinine, Ser: 1.28 mg/dL — ABNORMAL HIGH (ref 0.61–1.24)
GFR calc non Af Amer: 52 mL/min — ABNORMAL LOW (ref >60)
GLUCOSE: 166 mg/dL — AB (ref 65–99)
POTASSIUM: 3.8 mmol/L (ref 3.5–5.1)
SODIUM: 136 mmol/L (ref 135–145)
Total Bilirubin: 1.1 mg/dL (ref 0.3–1.2)
Total Protein: 6.5 g/dL (ref 6.5–8.1)

## 2017-07-22 LAB — LIPASE, BLOOD: Lipase: 22 U/L (ref 11–51)

## 2017-07-22 MED ORDER — SODIUM CHLORIDE 0.9 % IV BOLUS (SEPSIS)
1000.0000 mL | Freq: Once | INTRAVENOUS | Status: AC
  Administered 2017-07-22: 1000 mL via INTRAVENOUS

## 2017-07-22 MED ORDER — METHYLPREDNISOLONE SODIUM SUCC 125 MG IJ SOLR
125.0000 mg | Freq: Once | INTRAMUSCULAR | Status: AC
  Administered 2017-07-22: 125 mg via INTRAVENOUS
  Filled 2017-07-22: qty 2

## 2017-07-22 MED ORDER — ALBUTEROL SULFATE (2.5 MG/3ML) 0.083% IN NEBU
5.0000 mg | INHALATION_SOLUTION | Freq: Once | RESPIRATORY_TRACT | Status: AC
  Administered 2017-07-22: 5 mg via RESPIRATORY_TRACT

## 2017-07-22 MED ORDER — IOPAMIDOL (ISOVUE-300) INJECTION 61%
INTRAVENOUS | Status: AC
  Administered 2017-07-22: 100 mL via INTRAVENOUS
  Filled 2017-07-22: qty 100

## 2017-07-22 MED ORDER — MORPHINE SULFATE (PF) 4 MG/ML IV SOLN
4.0000 mg | Freq: Once | INTRAVENOUS | Status: AC
  Administered 2017-07-22: 4 mg via INTRAVENOUS
  Filled 2017-07-22: qty 1

## 2017-07-22 MED ORDER — IPRATROPIUM BROMIDE 0.02 % IN SOLN
0.5000 mg | Freq: Once | RESPIRATORY_TRACT | Status: AC
Start: 2017-07-22 — End: 2017-07-22
  Administered 2017-07-22: 0.5 mg via RESPIRATORY_TRACT

## 2017-07-22 NOTE — ED Notes (Signed)
Patient states he has also been having hallucinations when he takes a lot of pain medicine, especially before sleep, so he has not taken anything for pain today

## 2017-07-22 NOTE — H&P (Addendum)
TRH H&P   Patient Demographics:    Austin Stephenson, is a 77 y.o. male  MRN: [id number]   DOB - [birth year]  Admit Date - 07/22/2017  Outpatient Primary MD for the patient is [facility]  Referring MD/NP/PA:  Montine Circle  Outpatient Specialists:   Patient coming from: home  Chief Complaint  Patient presents with  . Abdominal Pain  . Cancer      HPI:    Austin Stephenson  is a 77 y.o. male, w h/o dm2, hypertension, hyperlipidemia, Copd, CHF (EF 40-45%)  Moderate AR, mild MR, ,  prostate cancer (metastatic), apparetnly c/o back pain.  Without radiation.  Has been getting worse for the past 2 weeks and therefore he presented to ED,  Dyspnea was a secondary complain.  Pt denies fever, chills, cp, palp, n/v, diarrhea, brbpr.   In Ed,  CT abd/ pelvis: CT ABDOMEN AND PELVIS IMPRESSION:  1. No acute abnormality identified within the abdomen and pelvis. 2. 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 No pathologic fracture. 3. Moderate aorto bi-iliac atherosclerotic disease.  No aneurysm.  CT LUMBAR SPINE IMPRESSION:  1. 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 No associated pathologic fracture. No significant extra osseous extension of tumor. 2. 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 3. Multifactorial foraminal narrowing as above, moderate to advanced at L3-4 through L5-S1.   CXR IMPRESSION: No acute disease.  Cardiomegaly.  COPD.  Atherosclerosis.  Wbc 6.2, hgb 14.6, Plt 163 Na 136, K 3.8, Bun 21, Creatinine 1.28 Ast 29, Alt 15 Alk phos 1,047 T. 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     Review of systems:    In addition to  the HPI above, No Fever-chills, No Headache, No changes with Vision or hearing, No problems swallowing food or Liquids, No Chest pain, Cough  No Abdominal pain, No Nausea or Vommitting, Bowel movements are regular, No Blood in stool or Urine, No dysuria, No new skin rashes or bruises,  No new weakness, tingling, numbness in any extremity, No recent weight gain or loss, No polyuria, polydypsia or polyphagia, No significant Mental Stressors.  A full 10 point Review of Systems was done, except as stated above, all other Review of Systems were negative.   With Past History of the following :    Past Medical History:  Diagnosis Date  . Arthritis   . Back pain   . Bipolar disorder ([place])   . Cancer [facility])    Metastatic prostate cancer  . CHF (congestive heart failure) (HCC)    takes Furosemide daily  . Chronic bronchitis ([place])    "get it just about q yr" (11/26/2014)  . COPD (chronic obstructive pulmonary disease) ([place])  Duoneb daily as needed.Inhaler daily  . Coronary artery disease    takes Plavix daily  . Cough    Tessalon daily and Mucinex daily as needed.Finishing up ZPAk today and Prednisone on 06/17/15  . Cough   . Dizziness   . Emphysema (subcutaneous) (surgical) resulting from a procedure   . Enlarged prostate    takes FLomax daily  . GERD (gastroesophageal reflux disease)    takes Protonix daily  . History of colon polyps    benign  . History of MRSA infection 2013  . History of shingles   . Hyperlipidemia    takes Simvastatin daily  . Hypertension    takes Metoprolol daily as well as Lisinopril  . Myocardial infarction ([place]) 2014  . Nocturia   . Peripheral edema   . Pneumonia    Nov 2016  . Shortness of breath dyspnea    with exertion  . Type II diabetes mellitus (HCC)    takes Novolog and Lantus daily.Average fasting blood sugar runs 120-130      Past Surgical History:  Procedure Laterality Date  . COLONOSCOPY    . CORONARY ARTERY BYPASS GRAFT   2014   at [place]; "CABG X3"  . ESOPHAGOGASTRODUODENOSCOPY N/A 12/21/2014   Procedure: ESOPHAGOGASTRODUODENOSCOPY (EGD);  Surgeon: Ladene Artist, MD;  Location: [facility] ENDOSCOPY;  Service: Endoscopy;  Laterality: N/A;  . INGUINAL HERNIA REPAIR Left    with mesh  . KNEE SURGERY Right   . MANDIBLE FRACTURE SURGERY  70's  . TONSILLECTOMY        Social History:     Social History   Tobacco Use  . Smoking status: Current Every Day Smoker    Packs/day: 1.00    Years: 60.00    Pack years: 60.00    Types: Cigarettes  . Smokeless tobacco: Never Used  Substance Use Topics  . Alcohol use: No    Comment: quit 15 yrs ago     Lives - at home   Mobility -walks by self   Family History :     Family History  Problem Relation Age of Onset  . Diabetes Father       Home Medications:   Prior to Admission medications   Medication Sig Start Date End Date Taking? Authorizing Provider  aspirin 81 MG EC tablet Take 1 tablet (81 mg total) by mouth daily after breakfast. Patient taking differently: Take 81 mg by mouth daily.  12/22/14   Rogue Bussing, MD  budesonide-formoterol [facility]) 160-4.5 MCG/ACT inhaler Inhale 2 puffs into the lungs 2 (two) times daily.    [provider]  furosemide (LASIX) 40 MG tablet Take 40 mg by mouth daily.    [provider]  gabapentin (NEURONTIN) 300 MG capsule Take 300 mg by mouth 3 (three) times daily.    [provider]  glipiZIDE (GLUCOTROL) 5 MG tablet Take 2.5 mg by mouth 2 (two) times daily before a meal.    [provider]  glucose 4 GM chewable tablet Chew 1 tablet by mouth See admin instructions. Chew 1-4 tablet (4 -16 gm) by mouth<70 until blood sugar is increased and dizziness stops    [provider]  insulin glargine (LANTUS) 100 UNIT/ML injection Inject 0.2 mLs (20 Units total) into the skin at bedtime. Patient taking differently: Inject 40 Units into the skin at bedtime.  12/22/14   Rogue Bussing, MD  ipratropium-albuterol (DUONEB) 0.5-2.5 (3) MG/3ML SOLN Take 3 mLs by nebulization every 6 (six) hours  as needed. 12/22/14   Rogue Bussing, MD  losartan (COZAAR) 50 MG tablet Take 25 mg by mouth daily.    [provider]  metoprolol succinate (TOPROL-XL) 100 MG 24 hr tablet Take 50 mg by mouth daily. Take with or immediately following a meal.    [provider]  predniSONE (STERAPRED UNI-PAK 21 TAB) 10 MG (21) TBPK tablet Take 2 tablets for 7 days and then 1 tablet for 4 days and then stop 02/14/17   Nita Sells, MD  PRESCRIPTION MEDICATION Take 1 each by nebulization 4 (four) times daily as needed (shortness of breath/wheezing/cough).    [provider]  PRESCRIPTION MEDICATION Inject 25 Units into the skin at bedtime. Long acting insulin    [provider]  PRESCRIPTION MEDICATION Inject 15-20 Units into the skin See admin instructions. Short acting insulin - inject 15 units subcutaneously with breakfast & 20 units with lunch and supper    [provider]  QUEtiapine (SEROQUEL) 100 MG tablet Take 2 tablets (200 mg total) by mouth at bedtime. Patient not taking: Reported on 02/10/2017 12/22/14   Rogue Bussing, MD  simvastatin (ZOCOR) 80 MG tablet Take 0.5 tablets (40 mg total) by mouth daily. Patient taking differently: Take 40 mg by mouth at bedtime.  12/22/14   Rogue Bussing, MD  tamsulosin (FLOMAX) 0.4 MG CAPS capsule Take 0.8 mg by mouth daily.     [provider]  traMADol (ULTRAM) 50 MG tablet Take 50 mg by mouth every 8 (eight) hours as needed (pain).    [provider]     Allergies:    No Known Allergies   Physical Exam:   Vitals  Blood pressure (!) 137/53, pulse 83, temperature 97.9 F (36.6 C), temperature source Oral, resp. rate 18, SpO2 (!) 88 %.   1. General  lying in bed in NAD,    2. Normal affect and insight, Not Suicidal or Homicidal, Awake Alert, Oriented  X 3.  3. No F.N deficits, ALL C.Nerves Intact, Strength 5/5 all 4 extremities, Sensation intact all 4 extremities, Plantars down going.  4. Ears and Eyes appear Normal, Conjunctivae clear, PERRLA. Moist Oral Mucosa.  5. Supple Neck, No JVD, No cervical lymphadenopathy appriciated, No Carotid Bruits.  6. Symmetrical Chest wall movement, Good air movement bilaterally, CTAB.  7. RRR, No Gallops, Rubs or Murmurs, No Parasternal Heave.  8. Positive Bowel Sounds, Abdomen Soft, No tenderness, No organomegaly appriciated,No rebound -guarding or rigidity.  9.  No Cyanosis, Normal Skin Turgor, No Skin Rash or Bruise.  10. Good muscle tone,  joints appear normal , no effusions, Normal ROM.  11. No Palpable Lymph Nodes in Neck or Axillae  SLR negative   Data Review:    CBC Recent Labs  Lab 07/22/17 2014  WBC 6.2  HGB 14.6  HCT 40.7  PLT 163  MCV 92.1  MCH 33.0  MCHC 35.9  RDW 13.2  LYMPHSABS 0.9  MONOABS 0.7  EOSABS 0.1  BASOSABS 0.0   ------------------------------------------------------------------------------------------------------------------  Chemistries  Recent Labs  Lab 07/22/17 2014  NA 136  K 3.8  CL 98*  CO2 27  GLUCOSE 166*  BUN 21*  CREATININE 1.28*  CALCIUM 8.7*  AST 29  ALT 15*  ALKPHOS 1,047*  BILITOT 1.1   ------------------------------------------------------------------------------------------------------------------ CrCl cannot be calculated (Unknown ideal weight.). ------------------------------------------------------------------------------------------------------------------ No results for input(s): TSH, T4TOTAL, T3FREE, THYROIDAB in the last 72 hours.  Invalid input(s): FREET3  Coagulation profile No results for input(s): INR, PROTIME  in the last 168 hours. ------------------------------------------------------------------------------------------------------------------- No results for input(s): DDIMER in the last 72  hours. -------------------------------------------------------------------------------------------------------------------  Cardiac Enzymes No results for input(s): CKMB, TROPONINI, MYOGLOBIN in the last 168 hours.  Invalid input(s): CK ------------------------------------------------------------------------------------------------------------------    Component Value Date/Time   BNP 377.6 (H) 02/10/2017 1646     ---------------------------------------------------------------------------------------------------------------  Urinalysis    Component Value Date/Time   COLORURINE YELLOW 07/22/2017 2029   APPEARANCEUR CLEAR 07/22/2017 2029   LABSPEC 1.011 07/22/2017 2029   PHURINE 5.0 07/22/2017 2029   GLUCOSEU NEGATIVE 07/22/2017 2029   HGBUR LARGE (A) 07/22/2017 2029   BILIRUBINUR NEGATIVE 07/22/2017 2029   KETONESUR 5 (A) 07/22/2017 2029   PROTEINUR 30 (A) 07/22/2017 2029   NITRITE NEGATIVE 07/22/2017 2029   LEUKOCYTESUR NEGATIVE 07/22/2017 2029    ----------------------------------------------------------------------------------------------------------------   Imaging Results:    Dg Chest 2 View  Result Date: 07/22/2017 CLINICAL DATA:  Cough and shortness of breath in a patient with a history of COPD. EXAM: CHEST  2 VIEW COMPARISON:  PA and lateral chest 02/10/2017 and 12/22/2014. FINDINGS: The lungs are emphysematous. No consolidative process, pneumothorax or effusion. There is cardiomegaly. The patient is status post CABG. Aortic atherosclerosis noted. IMPRESSION: No acute disease. Cardiomegaly. COPD. Atherosclerosis. Electronically Signed   By: Inge Rise M.D.   On: 07/22/2017 21:13   Ct Abdomen Pelvis W Contrast  Result Date: 07/22/2017 CLINICAL DATA:  Initial evaluation for metastatic prostate cancer, difficulty with pain management. Back pain. EXAM: CT ABDOMEN AND PELVIS WITH CONTRAST TECHNIQUE: Multidetector CT imaging of the abdomen and pelvis was performed using  the standard protocol following bolus administration of intravenous contrast. CONTRAST:  114m ISOVUE-300 IOPAMIDOL (ISOVUE-300) INJECTION 61% COMPARISON:  None. FINDINGS: CT ABDOMEN AND PELVIS FINDINGS Lower chest: Scattered linear atelectasis present within the visualized lung bases. Visualized lung bases are otherwise clear. Hepatobiliary: Liver demonstrates a normal contrast enhanced appearance. Gallbladder within normal limits. For NG and capped noted. Minimal intrahepatic biliary dilatation noted, of uncertain clinical significance. Pancreas: Pancreas within normal limits. Spleen: Subcentimeter hypodensity noted within the medial spleen, of doubtful significance. Spleen otherwise unremarkable. Adrenals/Urinary Tract: Adrenal glands are normal. Kidneys equal in size with symmetric enhancement. The subcentimeter hypodensity at the lower pole of the left kidney noted, too small the characterize, but statistically likely reflects a small cyst. No nephrolithiasis, hydronephrosis, or focal enhancing renal mass. No hydroureter. Moderate distension of the urinary bladder noted. Bladder otherwise unremarkable. Stomach/Bowel: Stomach within normal limits. No evidence for bowel obstruction. Appendix within normal limits. No acute inflammatory changes seen about the bowels. Vascular/Lymphatic: Moderate aorto bi-iliac atherosclerotic disease. No aneurysm. Mesenteric vessels patent proximally. No adenopathy. Reproductive: Prostate within normal limits. No appreciable prostatic mass. Other: No free air or fluid. Musculoskeletal: Diffuse osseous metastases seen throughout the pelvis and spine. No appreciable pathologic fracture. CT LUMBAR SPINE FINDINGS: Segmentation: Is normal segmentation. Lowest well-formed disc labeled the L5-S1 level. Alignment: Vertebral bodies normally aligned with preservation of the normal lumbar lordosis. No listhesis. Vertebrae: Diffuse abnormal sclerotic lesion seen throughout the visualized  osseous structures, consistent with widespread osseous metastases. No pathologic fracture. No significant extra osseous extension of tumor. Paraspinal and other soft tissues: Paraspinous soft tissues demonstrate no acute abnormality. Disc levels: Diffuse congenital shortening of the pedicles noted. L1-2: Diffuse disc bulge with disc desiccation. Mild bilateral facet arthrosis. Mild spinal stenosis, largely due to short pedicles. Foramina remain patent. L2-3: Mild diffuse disc bulge. Bilateral facet hypertrophy. Resultant mild spinal stenosis. Mild bilateral L2 foraminal narrowing. L3-4: Diffuse disc  bulge. Moderate bilateral facet hypertrophy. Changes superimposed on short pedicles result in moderate to advance spinal stenosis. Moderate to advanced bilateral L3 foraminal stenosis, right worse than left. L4-5: Diffuse disc bulge. Mild facet hypertrophy. Ligamentum flavum calcification. Changes superimposed on short pedicles results in moderate to severe spinal stenosis. Severe bilateral L4 foraminal narrowing. L5-S1: Diffuse disc bulge with intervertebral disc space narrowing. Reactive endplate changes. Mild bilateral facet hypertrophy. No significant spinal stenosis. Moderate right with advanced left L5 foraminal narrowing. IMPRESSION: CT ABDOMEN AND PELVIS IMPRESSION: 1. No acute abnormality identified within the abdomen and pelvis. 2. 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 No pathologic fracture. 3. Moderate aorto bi-iliac atherosclerotic disease.  No aneurysm. CT LUMBAR SPINE IMPRESSION: 1. 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 No associated pathologic fracture. No significant extra osseous extension of tumor. 2. 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 3. Multifactorial foraminal narrowing as above, moderate to advanced at L3-4 through L5-S1. Electronically Signed   By: Jeannine Boga M.D.   On: 07/22/2017 22:22    Ct L-spine No Charge  Result Date: 07/22/2017 CLINICAL DATA:  Initial evaluation for metastatic prostate cancer, difficulty with pain management. Back pain. EXAM: CT ABDOMEN AND PELVIS WITH CONTRAST TECHNIQUE: Multidetector CT imaging of the abdomen and pelvis was performed using the standard protocol following bolus administration of intravenous contrast. CONTRAST:  164m ISOVUE-300 IOPAMIDOL (ISOVUE-300) INJECTION 61% COMPARISON:  None. FINDINGS: CT ABDOMEN AND PELVIS FINDINGS Lower chest: Scattered linear atelectasis present within the visualized lung bases. Visualized lung bases are otherwise clear. Hepatobiliary: Liver demonstrates a normal contrast enhanced appearance. Gallbladder within normal limits. For NG and capped noted. Minimal intrahepatic biliary dilatation noted, of uncertain clinical significance. Pancreas: Pancreas within normal limits. Spleen: Subcentimeter hypodensity noted within the medial spleen, of doubtful significance. Spleen otherwise unremarkable. Adrenals/Urinary Tract: Adrenal glands are normal. Kidneys equal in size with symmetric enhancement. The subcentimeter hypodensity at the lower pole of the left kidney noted, too small the characterize, but statistically likely reflects a small cyst. No nephrolithiasis, hydronephrosis, or focal enhancing renal mass. No hydroureter. Moderate distension of the urinary bladder noted. Bladder otherwise unremarkable. Stomach/Bowel: Stomach within normal limits. No evidence for bowel obstruction. Appendix within normal limits. No acute inflammatory changes seen about the bowels. Vascular/Lymphatic: Moderate aorto bi-iliac atherosclerotic disease. No aneurysm. Mesenteric vessels patent proximally. No adenopathy. Reproductive: Prostate within normal limits. No appreciable prostatic mass. Other: No free air or fluid. Musculoskeletal: Diffuse osseous metastases seen throughout the pelvis and spine. No appreciable pathologic fracture. CT LUMBAR SPINE  FINDINGS: Segmentation: Is normal segmentation. Lowest well-formed disc labeled the L5-S1 level. Alignment: Vertebral bodies normally aligned with preservation of the normal lumbar lordosis. No listhesis. Vertebrae: Diffuse abnormal sclerotic lesion seen throughout the visualized osseous structures, consistent with widespread osseous metastases. No pathologic fracture. No significant extra osseous extension of tumor. Paraspinal and other soft tissues: Paraspinous soft tissues demonstrate no acute abnormality. Disc levels: Diffuse congenital shortening of the pedicles noted. L1-2: Diffuse disc bulge with disc desiccation. Mild bilateral facet arthrosis. Mild spinal stenosis, largely due to short pedicles. Foramina remain patent. L2-3: Mild diffuse disc bulge. Bilateral facet hypertrophy. Resultant mild spinal stenosis. Mild bilateral L2 foraminal narrowing. L3-4: Diffuse disc bulge. Moderate bilateral facet hypertrophy. Changes superimposed on short pedicles result in moderate to advance spinal stenosis. Moderate to advanced bilateral L3 foraminal stenosis, right worse than left. L4-5: Diffuse disc bulge. Mild facet hypertrophy. Ligamentum flavum calcification. Changes superimposed on  short pedicles results in moderate to severe spinal stenosis. Severe bilateral L4 foraminal narrowing. L5-S1: Diffuse disc bulge with intervertebral disc space narrowing. Reactive endplate changes. Mild bilateral facet hypertrophy. No significant spinal stenosis. Moderate right with advanced left L5 foraminal narrowing. IMPRESSION: CT ABDOMEN AND PELVIS IMPRESSION: 1. No acute abnormality identified within the abdomen and pelvis. 2. 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 No pathologic fracture. 3. Moderate aorto bi-iliac atherosclerotic disease.  No aneurysm. CT LUMBAR SPINE IMPRESSION: 1. 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 No associated pathologic fracture. No significant extra osseous  extension of tumor. 2. 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 3. Multifactorial foraminal narrowing as above, moderate to advanced at L3-4 through L5-S1. Electronically Signed   By: Jeannine Boga M.D.   On: 07/22/2017 22:22       Assessment & Plan:    Principal Problem:   COPD exacerbation ([place]) Active Problems:   DM (diabetes mellitus), type 2, uncontrolled, with renal complications (HCC)   Acute on chronic diastolic CHF (congestive heart failure) (HCC)   Prostate cancer (HCC)   Back pain    Back pain secondary to metastatic prostate cancer to bone Morphine sulfate 73m iv q4h prn Tramadol prn Consider palliative care consult in am vs radiation oncology  Abnormal alk phos Secondary to metastatic disease from prostate cancer Check cmp in am  Copd exacerbation, mild Prednisone 658mpo qday Cont symbicort Duoneb qid Cont duoneb  qid prn zithromax 50073mv qday  Dm2 fsbs ac and qhs, ISS Cont lantus Cont glipizide  CHF  Cont lasix 52m32m qday Cont Losartan 25mg104mqday Cont Metoprolol succinate 50mg 32mday  Hyperlipidemia Cont Flomax  Bph Cont flomax  Bipolar disorder Cont Seroquel  DVT Prophylaxis  Lovenox - SCDs  AM Labs Ordered, also please review Full Orders  Family Communication: Admission, patients condition and plan of care including tests being ordered have been discussed with the patient and who indicate understanding and agree with the plan and Code Status.  Code Status  FULL CODE  Likely DC to  home  Condition GUARDED    Consults called:  none  Admission status: inpatient  Time spent in minutes : 45   Ahnesty Finfrock Jani Graveln 07/22/2017 at 11:54 PM  Between 7am to 7pm - Pager - [phone] 7pm go to [email or web address] - password [email or web address]  [facility] - Office  [phone]

## 2017-07-22 NOTE — ED Provider Notes (Addendum)
[facility] EMERGENCY DEPARTMENT Provider Note   CSN: [id number] Arrival date & time: 07/22/17  2542     History   Chief Complaint Chief Complaint  Patient presents with  . Abdominal Pain  . Cancer    HPI Austin Stephenson is a 77 y.o. 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  Patient has been followed up with the [facility] and had a biopsy of his prostate that showed adenocarcinoma of the prostate about a month ago.  Patient also had a bone scan that showed multiple metastases to the bone.  Patient moved here to [facility] last month and has a Education officer, museum that helps him.  He currently resides in a shelter and she noticed that he seems to be in a lot of pain and had trouble walking around.  Denies actual falls.  Has been smoking and has been coughing as well.  Denies any fevers or chills or trouble urinating.   The history is provided by the patient.    Past Medical History:  Diagnosis Date  . Arthritis   . Back pain   . Bipolar disorder ([place])   . Cancer [employer])    Metastatic prostate cancer  . CHF (congestive heart failure) (HCC)    takes Furosemide daily  . Chronic bronchitis ([place])    "get it just about q yr" (11/26/2014)  . COPD (chronic obstructive pulmonary disease) ([place])    Duoneb daily as needed.Inhaler daily  . Coronary artery disease    takes Plavix daily  . Cough    Tessalon daily and Mucinex daily as needed.Finishing up ZPAk today and Prednisone on 06/17/15  . Cough   . Dizziness   . Emphysema (subcutaneous) (surgical) resulting from a procedure   . Enlarged prostate    takes FLomax daily  . GERD (gastroesophageal reflux disease)    takes Protonix daily  . History of colon polyps    benign  . History of MRSA infection 2013  . History of shingles   . Hyperlipidemia    takes Simvastatin daily  . Hypertension    takes Metoprolol daily as well as  Lisinopril  . Myocardial infarction ([place]) 2014  . Nocturia   . Peripheral edema   . Pneumonia    Nov 2016  . Shortness of breath dyspnea    with exertion  . Type II diabetes mellitus (HCC)    takes Novolog and Lantus daily.Average fasting blood sugar runs 120-130    Patient Active Problem List   Diagnosis Date Noted  . Bipolar disorder ([place]) 02/10/2017  . Acute on chronic diastolic CHF (congestive heart failure) ([place]) 02/10/2017  . Coronary artery disease 02/10/2017  . Acute respiratory failure with hypoxia ([place]) 02/10/2017  . Esophageal ulcer   . DM (diabetes mellitus), type 2, uncontrolled, with renal complications ([place])   . CKD (chronic kidney disease) stage 3, GFR 30-59 ml/min (HCC)   . Hematemesis 12/20/2014  . Hemoptysis   . COPD mixed type ([place])   . Acute pulmonary edema (HCC)   . COPD exacerbation ([place])   . History of coronary artery bypass graft   . Essential hypertension   . Dyspnea 11/26/2014    Past Surgical History:  Procedure Laterality Date  . COLONOSCOPY    . CORONARY ARTERY BYPASS GRAFT  2014   at [facility]; "CABG X3"  . ESOPHAGOGASTRODUODENOSCOPY N/A 12/21/2014   Procedure: ESOPHAGOGASTRODUODENOSCOPY (EGD);  Surgeon: Ladene Artist,  MD;  Location: [facility];  Service: Endoscopy;  Laterality: N/A;  . INGUINAL HERNIA REPAIR Left    with mesh  . KNEE SURGERY Right   . MANDIBLE FRACTURE SURGERY  70's  . TONSILLECTOMY         Home Medications    Prior to Admission medications   Medication Sig Start Date End Date Taking? Authorizing Provider  aspirin 81 MG EC tablet Take 1 tablet (81 mg total) by mouth daily after breakfast. Patient taking differently: Take 81 mg by mouth daily.  12/22/14   Rogue Bussing, MD  budesonide-formoterol [facility]) 160-4.5 MCG/ACT inhaler Inhale 2 puffs into the lungs 2 (two) times daily.    [provider]  furosemide (LASIX) 40 MG tablet Take 40 mg by mouth daily.    [provider]  gabapentin  (NEURONTIN) 300 MG capsule Take 300 mg by mouth 3 (three) times daily.    [provider]  glipiZIDE (GLUCOTROL) 5 MG tablet Take 2.5 mg by mouth 2 (two) times daily before a meal.    [provider]  glucose 4 GM chewable tablet Chew 1 tablet by mouth See admin instructions. Chew 1-4 tablet (4 -16 gm) by mouth<70 until blood sugar is increased and dizziness stops    [provider]  insulin glargine (LANTUS) 100 UNIT/ML injection Inject 0.2 mLs (20 Units total) into the skin at bedtime. Patient taking differently: Inject 40 Units into the skin at bedtime.  12/22/14   Rogue Bussing, MD  ipratropium-albuterol (DUONEB) 0.5-2.5 (3) MG/3ML SOLN Take 3 mLs by nebulization every 6 (six) hours as needed. 12/22/14   Rogue Bussing, MD  losartan (COZAAR) 50 MG tablet Take 25 mg by mouth daily.    [provider]  metoprolol succinate (TOPROL-XL) 100 MG 24 hr tablet Take 50 mg by mouth daily. Take with or immediately following a meal.    [provider]  predniSONE (STERAPRED UNI-PAK 21 TAB) 10 MG (21) TBPK tablet Take 2 tablets for 7 days and then 1 tablet for 4 days and then stop 02/14/17   Nita Sells, MD  PRESCRIPTION MEDICATION Take 1 each by nebulization 4 (four) times daily as needed (shortness of breath/wheezing/cough).    [provider]  PRESCRIPTION MEDICATION Inject 25 Units into the skin at bedtime. Long acting insulin    [provider]  PRESCRIPTION MEDICATION Inject 15-20 Units into the skin See admin instructions. Short acting insulin - inject 15 units subcutaneously with breakfast & 20 units with lunch and supper    [provider]  QUEtiapine (SEROQUEL) 100 MG tablet Take 2 tablets (200 mg total) by mouth at bedtime. Patient not taking: Reported on 02/10/2017 12/22/14   Rogue Bussing, MD  simvastatin (ZOCOR) 80 MG tablet Take 0.5 tablets (40 mg total) by mouth daily. Patient taking  differently: Take 40 mg by mouth at bedtime.  12/22/14   Rogue Bussing, MD  tamsulosin (FLOMAX) 0.4 MG CAPS capsule Take 0.8 mg by mouth daily.     [provider]  traMADol (ULTRAM) 50 MG tablet Take 50 mg by mouth every 8 (eight) hours as needed (pain).    [provider]    Family History Family History  Problem Relation Age of Onset  . Diabetes Father     Social History Social History   Tobacco Use  . Smoking status: Current Every Day Smoker    Packs/day: 1.00    Years: 60.00    Pack years:  60.00    Types: Cigarettes  . Smokeless tobacco: Never Used  Substance Use Topics  . Alcohol use: No    Comment: quit 15 yrs ago  . Drug use: Yes     Allergies   Patient has no known allergies.   Review of Systems Review of Systems  Gastrointestinal: Positive for abdominal pain.  All other systems reviewed and are negative.    Physical Exam Updated Vital Signs BP (!) 159/64   Pulse 99   Temp 97.9 F (36.6 C) (Oral)   Resp 18   SpO2 95%   Physical Exam  Constitutional:  Chronically ill   HENT:  Head: Normocephalic.  Mouth/Throat: Oropharynx is clear and moist.  Eyes: EOM are normal. Pupils are equal, round, and reactive to light.  Cardiovascular: Normal rate, regular rhythm and normal heart sounds.  Pulmonary/Chest:  Tachypneic, + diffuse wheezing, no retractions   Abdominal: Soft. Normal appearance and bowel sounds are normal.  Neurological: He is alert.  Skin: Skin is warm.  Psychiatric: He has a normal mood and affect. His behavior is normal.  Nursing note and vitals reviewed.    ED Treatments / Results  Labs (all labs ordered are listed, but only abnormal results are displayed) Labs Reviewed  COMPREHENSIVE METABOLIC PANEL - Abnormal; Notable for the following components:      Result Value   Chloride 98 (*)    Glucose, Bld 166 (*)    BUN 21 (*)    Creatinine, Ser 1.28 (*)    Calcium 8.7 (*)    ALT 15 (*)    Alkaline  Phosphatase 1,047 (*)    GFR calc non Af Amer 52 (*)    All other components within normal limits  URINALYSIS, ROUTINE W REFLEX MICROSCOPIC - Abnormal; Notable for the following components:   Hgb urine dipstick LARGE (*)    Ketones, ur 5 (*)    Protein, ur 30 (*)    All other components within normal limits  CBC WITH DIFFERENTIAL/PLATELET  LIPASE, BLOOD    EKG  EKG Interpretation None       Radiology Dg Chest 2 View  Result Date: 07/22/2017 CLINICAL DATA:  Cough and shortness of breath in a patient with a history of COPD. EXAM: CHEST  2 VIEW COMPARISON:  PA and lateral chest 02/10/2017 and 12/22/2014. FINDINGS: The lungs are emphysematous. No consolidative process, pneumothorax or effusion. There is cardiomegaly. The patient is status post CABG. Aortic atherosclerosis noted. IMPRESSION: No acute disease. Cardiomegaly. COPD. Atherosclerosis. Electronically Signed   By: Inge Rise M.D.   On: 07/22/2017 21:13    Procedures Procedures (including critical care time)  Medications Ordered in ED Medications  sodium chloride 0.9 % bolus 1,000 mL (0 mLs Intravenous Stopped 07/22/17 2109)  morphine 4 MG/ML injection 4 mg (4 mg Intravenous Given 07/22/17 2012)  albuterol (PROVENTIL) (2.5 MG/3ML) 0.083% nebulizer solution 5 mg (5 mg Nebulization Given 07/22/17 1943)  ipratropium (ATROVENT) nebulizer solution 0.5 mg (0.5 mg Nebulization Given 07/22/17 1946)  methylPREDNISolone sodium succinate (SOLU-MEDROL) 125 mg/2 mL injection 125 mg (125 mg Intravenous Given 07/22/17 2012)  iopamidol (ISOVUE-300) 61 % injection (100 mLs Intravenous Contrast Given 07/22/17 2130)     Initial Impression / Assessment and Plan / ED Course  I have reviewed the triage vital signs and the nursing notes.  Pertinent labs & imaging results that were available during my care of the patient were reviewed by me and considered in my medical decision making (see chart for details).  Austin Stephenson is a 77 y.o. male hx  of prostate cancer with abdominal pain, back pain. He also has unsafe social situation as well. Will get labs, UA, CT ab/pel. Will leave message with case management.   10:12 PM Labs unremarkable. CT pending. Anticipate admission for COPD exacerbation, pain control, likely further workup for prostate cancer. Signed out to [employer] in the ED.    Final Clinical Impressions(s) / ED Diagnoses   Final diagnoses:  Back pain    ED Discharge Orders    None       Drenda Freeze, MD 07/22/17 2213    Drenda Freeze, MD 07/22/17 2213

## 2017-07-22 NOTE — ED Triage Notes (Signed)
Patient presents to ED for assessment of metastatic prostate cancer.  Patient is having difficulty managing his pain at home.  Patient's family is not local and is also concerned for patient living alone until they can make further arrangements.

## 2017-07-22 NOTE — ED Provider Notes (Signed)
Admit for pain control and oncologic workup.  Sits up in bed and O2 sat drops to 86-88%. Still has some wheezing on my exam. States can't walk due to both pain and SOB.  Appreciate Dr. Maudie Mercury for admitting the patient.  Please see Dr. Kathleen Lime note for complete H&P.   Montine Circle, PA-C 07/22/17 2337    Drenda Freeze, MD 07/23/17 [phone]

## 2017-07-22 NOTE — ED Notes (Signed)
Pt given turkey sandwich and diet ginger ale.   

## 2017-07-22 NOTE — ED Notes (Signed)
Writer ambulated with pt, pt took a couple of steps, O2 dropped to 91%, pt complain of having too much pain to ambulate with Probation officer.

## 2017-07-23 ENCOUNTER — Other Ambulatory Visit: Payer: Self-pay

## 2017-07-23 DIAGNOSIS — E1165 Type 2 diabetes mellitus with hyperglycemia: Secondary | ICD-10-CM

## 2017-07-23 DIAGNOSIS — M545 Low back pain: Secondary | ICD-10-CM

## 2017-07-23 DIAGNOSIS — G8929 Other chronic pain: Secondary | ICD-10-CM

## 2017-07-23 DIAGNOSIS — E1129 Type 2 diabetes mellitus with other diabetic kidney complication: Secondary | ICD-10-CM

## 2017-07-23 DIAGNOSIS — I5033 Acute on chronic diastolic (congestive) heart failure: Secondary | ICD-10-CM

## 2017-07-23 DIAGNOSIS — G893 Neoplasm related pain (acute) (chronic): Secondary | ICD-10-CM

## 2017-07-23 LAB — COMPREHENSIVE METABOLIC PANEL
ALBUMIN: 3.3 g/dL — AB (ref 3.5–5.0)
ALT: 15 U/L — AB (ref 17–63)
AST: 25 U/L (ref 15–41)
Alkaline Phosphatase: 1029 U/L — ABNORMAL HIGH (ref 38–126)
Anion gap: 12 (ref 5–15)
BUN: 23 mg/dL — AB (ref 6–20)
CO2: 25 mmol/L (ref 22–32)
CREATININE: 1.37 mg/dL — AB (ref 0.61–1.24)
Calcium: 8.5 mg/dL — ABNORMAL LOW (ref 8.9–10.3)
Chloride: 100 mmol/L — ABNORMAL LOW (ref 101–111)
GFR calc Af Amer: 56 mL/min — ABNORMAL LOW (ref >60)
GFR calc non Af Amer: 48 mL/min — ABNORMAL LOW (ref >60)
Glucose, Bld: 384 mg/dL — ABNORMAL HIGH (ref 65–99)
POTASSIUM: 4 mmol/L (ref 3.5–5.1)
SODIUM: 137 mmol/L (ref 135–145)
Total Bilirubin: 0.9 mg/dL (ref 0.3–1.2)
Total Protein: 6.5 g/dL (ref 6.5–8.1)

## 2017-07-23 LAB — GLUCOSE, CAPILLARY
GLUCOSE-CAPILLARY: 361 mg/dL — AB (ref 65–99)
GLUCOSE-CAPILLARY: 321 mg/dL — AB (ref 65–99)
Glucose-Capillary: 297 mg/dL — ABNORMAL HIGH (ref 65–99)
Glucose-Capillary: 342 mg/dL — ABNORMAL HIGH (ref 65–99)
Glucose-Capillary: 411 mg/dL — ABNORMAL HIGH (ref 65–99)

## 2017-07-23 LAB — CBG MONITORING, ED: GLUCOSE-CAPILLARY: 292 mg/dL — AB (ref 65–99)

## 2017-07-23 LAB — MRSA PCR SCREENING: MRSA BY PCR: NEGATIVE

## 2017-07-23 MED ORDER — INSULIN ASPART 100 UNIT/ML ~~LOC~~ SOLN
0.0000 [IU] | Freq: Every day | SUBCUTANEOUS | Status: DC
  Administered 2017-07-23: 4 [IU] via SUBCUTANEOUS

## 2017-07-23 MED ORDER — IPRATROPIUM-ALBUTEROL 0.5-2.5 (3) MG/3ML IN SOLN
3.0000 mL | Freq: Three times a day (TID) | RESPIRATORY_TRACT | Status: DC
  Administered 2017-07-23 – 2017-07-26 (×7): 3 mL via RESPIRATORY_TRACT
  Filled 2017-07-23 (×10): qty 3

## 2017-07-23 MED ORDER — GLIPIZIDE 2.5 MG HALF TABLET
2.5000 mg | ORAL_TABLET | Freq: Two times a day (BID) | ORAL | Status: DC
  Administered 2017-07-23 – 2017-07-26 (×7): 2.5 mg via ORAL
  Filled 2017-07-23 (×8): qty 1

## 2017-07-23 MED ORDER — GABAPENTIN 300 MG PO CAPS
300.0000 mg | ORAL_CAPSULE | Freq: Three times a day (TID) | ORAL | Status: DC
  Administered 2017-07-23: 300 mg via ORAL
  Filled 2017-07-23 (×6): qty 1

## 2017-07-23 MED ORDER — ACETAMINOPHEN 325 MG PO TABS: 650.0000 mg | ORAL_TABLET | Freq: Four times a day (QID) | ORAL | Status: DC | PRN

## 2017-07-23 MED ORDER — OXYCODONE HCL 5 MG PO TABS: 10.0000 mg | ORAL_TABLET | Freq: Four times a day (QID) | ORAL | Status: DC | PRN

## 2017-07-23 MED ORDER — LOSARTAN POTASSIUM 25 MG PO TABS
25.0000 mg | ORAL_TABLET | Freq: Every day | ORAL | Status: DC
  Administered 2017-07-24 – 2017-07-26 (×3): 25 mg via ORAL
  Filled 2017-07-23 (×4): qty 1

## 2017-07-23 MED ORDER — SODIUM CHLORIDE 0.9 % IV SOLN
500.0000 mg | Freq: Every day | INTRAVENOUS | Status: DC
  Administered 2017-07-23 – 2017-07-26 (×4): 500 mg via INTRAVENOUS
  Filled 2017-07-23 (×4): qty 500

## 2017-07-23 MED ORDER — ACETAMINOPHEN 650 MG RE SUPP: 650.0000 mg | Freq: Four times a day (QID) | RECTAL | Status: DC | PRN

## 2017-07-23 MED ORDER — PREDNISONE 50 MG PO TABS
60.0000 mg | ORAL_TABLET | Freq: Every day | ORAL | Status: DC
  Administered 2017-07-23 – 2017-07-26 (×4): 60 mg via ORAL
  Filled 2017-07-23 (×6): qty 1

## 2017-07-23 MED ORDER — METOPROLOL SUCCINATE ER 50 MG PO TB24
50.0000 mg | ORAL_TABLET | Freq: Every day | ORAL | Status: DC
  Administered 2017-07-24 – 2017-07-26 (×3): 50 mg via ORAL
  Filled 2017-07-23 (×4): qty 1

## 2017-07-23 MED ORDER — INSULIN ASPART 100 UNIT/ML ~~LOC~~ SOLN
0.0000 [IU] | Freq: Three times a day (TID) | SUBCUTANEOUS | Status: DC
  Administered 2017-07-23: 20 [IU] via SUBCUTANEOUS
  Administered 2017-07-24: 7 [IU] via SUBCUTANEOUS
  Administered 2017-07-24: 15 [IU] via SUBCUTANEOUS
  Administered 2017-07-24 – 2017-07-25 (×2): 4 [IU] via SUBCUTANEOUS
  Administered 2017-07-26: 3 [IU] via SUBCUTANEOUS
  Administered 2017-07-26: 4 [IU] via SUBCUTANEOUS

## 2017-07-23 MED ORDER — FUROSEMIDE 40 MG PO TABS
40.0000 mg | ORAL_TABLET | Freq: Every day | ORAL | Status: DC
  Administered 2017-07-24 – 2017-07-26 (×3): 40 mg via ORAL
  Filled 2017-07-23 (×4): qty 1

## 2017-07-23 MED ORDER — SODIUM CHLORIDE 0.9 % IV SOLN
INTRAVENOUS | Status: AC
  Administered 2017-07-23: 03:00:00 via INTRAVENOUS

## 2017-07-23 MED ORDER — INSULIN GLARGINE 100 UNIT/ML ~~LOC~~ SOLN
40.0000 [IU] | Freq: Every day | SUBCUTANEOUS | Status: DC
  Administered 2017-07-23 – 2017-07-25 (×3): 40 [IU] via SUBCUTANEOUS
  Filled 2017-07-23 (×4): qty 0.4

## 2017-07-23 MED ORDER — TRAMADOL HCL 50 MG PO TABS: 50.0000 mg | ORAL_TABLET | Freq: Three times a day (TID) | ORAL | Status: DC | PRN

## 2017-07-23 MED ORDER — ATORVASTATIN CALCIUM 40 MG PO TABS
40.0000 mg | ORAL_TABLET | Freq: Every day | ORAL | Status: DC
  Administered 2017-07-23 – 2017-07-26 (×4): 40 mg via ORAL
  Filled 2017-07-23 (×4): qty 1

## 2017-07-23 MED ORDER — TAMSULOSIN HCL 0.4 MG PO CAPS
0.8000 mg | ORAL_CAPSULE | Freq: Every day | ORAL | Status: DC
  Administered 2017-07-23 – 2017-07-26 (×4): 0.8 mg via ORAL
  Filled 2017-07-23 (×4): qty 2

## 2017-07-23 MED ORDER — QUETIAPINE FUMARATE 25 MG PO TABS
200.0000 mg | ORAL_TABLET | Freq: Every day | ORAL | Status: DC
  Administered 2017-07-23 – 2017-07-25 (×4): 200 mg via ORAL
  Filled 2017-07-23 (×4): qty 8

## 2017-07-23 MED ORDER — MORPHINE SULFATE (PF) 2 MG/ML IV SOLN
1.0000 mg | INTRAVENOUS | Status: DC | PRN
Start: 2017-07-23 — End: 2017-07-26
  Administered 2017-07-23: 1 mg via INTRAVENOUS
  Filled 2017-07-23: qty 1

## 2017-07-23 MED ORDER — ASPIRIN EC 81 MG PO TBEC
81.0000 mg | DELAYED_RELEASE_TABLET | Freq: Every day | ORAL | Status: DC
  Administered 2017-07-23 – 2017-07-26 (×4): 81 mg via ORAL
  Filled 2017-07-23 (×4): qty 1

## 2017-07-23 MED ORDER — IPRATROPIUM-ALBUTEROL 0.5-2.5 (3) MG/3ML IN SOLN: 3.0000 mL | Freq: Four times a day (QID) | RESPIRATORY_TRACT | Status: DC | PRN

## 2017-07-23 MED ORDER — INSULIN ASPART 100 UNIT/ML ~~LOC~~ SOLN
0.0000 [IU] | Freq: Three times a day (TID) | SUBCUTANEOUS | Status: DC
  Administered 2017-07-23: 7 [IU] via SUBCUTANEOUS
  Administered 2017-07-23: 9 [IU] via SUBCUTANEOUS

## 2017-07-23 MED ORDER — MOMETASONE FURO-FORMOTEROL FUM 200-5 MCG/ACT IN AERO
2.0000 | INHALATION_SPRAY | Freq: Two times a day (BID) | RESPIRATORY_TRACT | Status: DC
  Administered 2017-07-23 – 2017-07-26 (×5): 2 via RESPIRATORY_TRACT
  Filled 2017-07-23: qty 8.8

## 2017-07-23 MED ORDER — IPRATROPIUM-ALBUTEROL 0.5-2.5 (3) MG/3ML IN SOLN
3.0000 mL | Freq: Four times a day (QID) | RESPIRATORY_TRACT | Status: DC
Start: 2017-07-23 — End: 2017-07-23
  Administered 2017-07-23 (×2): 3 mL via RESPIRATORY_TRACT
  Filled 2017-07-23 (×2): qty 3

## 2017-07-23 NOTE — Progress Notes (Signed)
Patient sleeping during shift report.      

## 2017-07-23 NOTE — Progress Notes (Signed)
Per CCMD, patient had a quick fun of SVT, HR only reached 150. Will continue to monitor patient.

## 2017-07-23 NOTE — ED Notes (Signed)
Checked CBG 292, RN Tray informed

## 2017-07-23 NOTE — Progress Notes (Signed)
Pt is alert oriented reoriented o2 and added water to humidify, Po meds given along with 40 of lantus CBG 321 will check every 4 hours as ordered. Gave teaching on reducing carbs. guarded with care

## 2017-07-23 NOTE — Progress Notes (Signed)
Pt appears very groggy, mumbles its from the medication he received. Pt has equal bilateral strength, and follows commands. Pt states he would like to sleep. Pt has audible rhonchi and fall asleep quickly after being woken.   Pt could not safely eat right now if he tried due to the level of sedation. Per night RN he had morphine and a large dose of Seroquel last night.   Pt with high glucose this am, requires insulin will admin and recheck glucose in 20 to verify proper response without breakfast on board.

## 2017-07-23 NOTE — Progress Notes (Signed)
Patients glucose was rechecked, as technology glitched and initial reading did not cross over to computer.   Second was higher, at 361- insulin administered per scale.  Pt also sat up independently to eat breakfast, before assigned RN and second RN could get to the room for second verification of pt condition.  Any concerns previously present have been resolved; pt is up and quite alert.

## 2017-07-23 NOTE — Progress Notes (Signed)
Spoke to social worker Gwenyth Allegra, who brought the patient to the hospital last night. Pt gave verbal permission to this nurse for hospital staff/social work to communicate with her regarding patient care/needs for home.   Contact information in shadow/hard chart.

## 2017-07-23 NOTE — Plan of Care (Signed)
  Education: Knowledge of General Education information will improve 07/23/2017 0344 - Progressing by Tristan Schroeder, RN   Health Behavior/Discharge Planning: Ability to manage health-related needs will improve 07/23/2017 0344 - Progressing by Tristan Schroeder, RN   Pain Managment: General experience of comfort will improve 07/23/2017 0344 - Progressing by Tristan Schroeder, RN

## 2017-07-23 NOTE — Progress Notes (Signed)
3e10 CBG 411 (300s all day, prior to prednisone) per SS >400 page MD  Page to Dr Wendee Beavers

## 2017-07-23 NOTE — Progress Notes (Signed)
Patient arrived to unit, ambulated to bathroom and bed. Complains of 10/10 pain. Patient updated on plan of care. Safety precautions in place and patient oriented to unit. Patient has Right AC IV. Skin assessed with 2nd RN.

## 2017-07-23 NOTE — Progress Notes (Addendum)
Patient seen and evaluated earlier this a.m. by my associate. I will plan for pain control today as patient still complaining of much discomfort. I did read recommendations by initial physician to recheck the palliative versus radiation oncology. I do not see any urgency to do it today being that today is Sunday. As such will work on pain control and consult radiation oncology most likely next a.m.  Gen.: Patient in no acute distress Cardiovascular: No cyanosis Pulmonary: Nasal cannula in place and increased work of breathing, speaking in full sentences  [place]

## 2017-07-23 NOTE — Care Management Note (Signed)
Case Management Note  Patient Details  Name: Austin Stephenson MRN: [id number] Date of Birth: [birth year]  Subjective/Objective:                 Spoke to patient at the bedside. He states that he now has his "own place" on [address], uses [place] transportation to appointments, and is not currently followed by [facility]. He states he had oxygen in the past that the [place] took back. Will need to watch for oxygen needs at [place]. Patient states his PCP is Dr Romero Liner at the [facility] in [place].    Action/Plan:   Expected Discharge Date:                  Expected Discharge Plan:     In-House Referral:     Discharge planning Services     Post Acute Care Choice:    Choice offered to:     DME Arranged:    DME Agency:     HH Arranged:    HH Agency:     Status of Service:     If discussed at [employer] of Stay Meetings, dates discussed:    Additional Comments:  Carles Collet, RN 07/23/2017, 1:21 PM

## 2017-07-23 NOTE — Progress Notes (Signed)
Patient states he smokes, but is trying to quit. Placed patient on O2 via nasal cannula @ 2L. Patient states he wears bilateral hearing aids, but did not bring them. Patient wears upper/lower dentures, but only brought uppers and want to keep them in his pocket- located in patient closet.

## 2017-07-24 ENCOUNTER — Inpatient Hospital Stay
Admission: RE | Admit: 2017-07-24 | Discharge: 2017-07-24 | Disposition: A | Discharge status: Home / Self Care | Payer: Non-veteran care | Source: Ambulatory Visit | Attending: Radiation Oncology | Admitting: Radiation Oncology

## 2017-07-24 ENCOUNTER — Ambulatory Visit
Admission: RE | Admit: 2017-07-24 | Discharge: 2017-07-24 | Disposition: A | Discharge status: Home / Self Care | Payer: Non-veteran care | Source: Ambulatory Visit | Attending: Radiation Oncology | Admitting: Radiation Oncology

## 2017-07-24 DIAGNOSIS — E119 Type 2 diabetes mellitus without complications: Secondary | ICD-10-CM

## 2017-07-24 DIAGNOSIS — C7951 Secondary malignant neoplasm of bone: Secondary | ICD-10-CM

## 2017-07-24 DIAGNOSIS — K219 Gastro-esophageal reflux disease without esophagitis: Secondary | ICD-10-CM

## 2017-07-24 DIAGNOSIS — G893 Neoplasm related pain (acute) (chronic): Principal | ICD-10-CM

## 2017-07-24 DIAGNOSIS — Z8614 Personal history of Methicillin resistant Staphylococcus aureus infection: Secondary | ICD-10-CM

## 2017-07-24 DIAGNOSIS — C61 Malignant neoplasm of prostate: Secondary | ICD-10-CM

## 2017-07-24 DIAGNOSIS — I10 Essential (primary) hypertension: Secondary | ICD-10-CM

## 2017-07-24 DIAGNOSIS — Z801 Family history of malignant neoplasm of trachea, bronchus and lung: Secondary | ICD-10-CM

## 2017-07-24 DIAGNOSIS — Z515 Encounter for palliative care: Secondary | ICD-10-CM

## 2017-07-24 DIAGNOSIS — I509 Heart failure, unspecified: Secondary | ICD-10-CM

## 2017-07-24 DIAGNOSIS — R609 Edema, unspecified: Secondary | ICD-10-CM

## 2017-07-24 DIAGNOSIS — J441 Chronic obstructive pulmonary disease with (acute) exacerbation: Secondary | ICD-10-CM

## 2017-07-24 DIAGNOSIS — F319 Bipolar disorder, unspecified: Secondary | ICD-10-CM

## 2017-07-24 DIAGNOSIS — R351 Nocturia: Secondary | ICD-10-CM

## 2017-07-24 DIAGNOSIS — Z66 Do not resuscitate: Secondary | ICD-10-CM

## 2017-07-24 DIAGNOSIS — I2510 Atherosclerotic heart disease of native coronary artery without angina pectoris: Secondary | ICD-10-CM

## 2017-07-24 DIAGNOSIS — I252 Old myocardial infarction: Secondary | ICD-10-CM

## 2017-07-24 DIAGNOSIS — M549 Dorsalgia, unspecified: Secondary | ICD-10-CM

## 2017-07-24 DIAGNOSIS — Z7982 Long term (current) use of aspirin: Secondary | ICD-10-CM

## 2017-07-24 DIAGNOSIS — C49A9 Gastrointestinal stromal tumor of other sites: Secondary | ICD-10-CM

## 2017-07-24 DIAGNOSIS — J449 Chronic obstructive pulmonary disease, unspecified: Secondary | ICD-10-CM

## 2017-07-24 DIAGNOSIS — Z794 Long term (current) use of insulin: Secondary | ICD-10-CM

## 2017-07-24 DIAGNOSIS — Z86010 Personal history of colonic polyps: Secondary | ICD-10-CM

## 2017-07-24 DIAGNOSIS — M48061 Spinal stenosis, lumbar region without neurogenic claudication: Secondary | ICD-10-CM

## 2017-07-24 DIAGNOSIS — E785 Hyperlipidemia, unspecified: Secondary | ICD-10-CM

## 2017-07-24 DIAGNOSIS — R42 Dizziness and giddiness: Secondary | ICD-10-CM

## 2017-07-24 DIAGNOSIS — I739 Peripheral vascular disease, unspecified: Secondary | ICD-10-CM

## 2017-07-24 LAB — GLUCOSE, CAPILLARY
GLUCOSE-CAPILLARY: 225 mg/dL — AB (ref 65–99)
GLUCOSE-CAPILLARY: 201 mg/dL — AB (ref 65–99)
GLUCOSE-CAPILLARY: 159 mg/dL — AB (ref 65–99)
Glucose-Capillary: 347 mg/dL — ABNORMAL HIGH (ref 65–99)
Glucose-Capillary: 194 mg/dL — ABNORMAL HIGH (ref 65–99)
Glucose-Capillary: 304 mg/dL — ABNORMAL HIGH (ref 65–99)

## 2017-07-24 LAB — PSA: Prostatic Specific Antigen: 16.12 ng/mL — ABNORMAL HIGH (ref 0.00–4.00)

## 2017-07-24 MED ORDER — HEPARIN SODIUM (PORCINE) 5000 UNIT/ML IJ SOLN
5000.0000 [IU] | Freq: Three times a day (TID) | INTRAMUSCULAR | Status: DC
  Administered 2017-07-24 – 2017-07-26 (×7): 5000 [IU] via SUBCUTANEOUS
  Filled 2017-07-24 (×7): qty 1

## 2017-07-24 MED ORDER — OXYCODONE HCL 5 MG PO TABS
5.0000 mg | ORAL_TABLET | Freq: Four times a day (QID) | ORAL | Status: DC | PRN
  Administered 2017-07-26: 10 mg via ORAL
  Filled 2017-07-24: qty 2

## 2017-07-24 NOTE — Clinical Social Work Note (Signed)
CSW acknowledges consult regarding homelessness and transportation issues. Per yesterday's RNCM note, patient now has a place to live and uses [place] transportation to get to appointments.  CSW signing off. Consult again if any other social work needs arise.  Austin Stephenson, [place]

## 2017-07-24 NOTE — Consult Note (Signed)
New Hematology/Oncology Consult   Referral ZO:XWRUEAV Stephenson      Reason for Referral: "Prostate cancer "  HPI: Austin Stephenson was admitted 07/22/2017 with pain at the lower back/sacrum that has progressed over several weeks.  He reports the pain is now under control with morphine. A CT of the abdomen and pelvis 07/22/2017 revealed no adenopathy.  The prostate appeared normal.  Diffuse osseous metastatic disease was seen throughout the pelvis and spine.  No pathologic fracture.  A CT of the lumbar spine revealed diffuse sclerotic metastases.  No pathologic fracture.  No extraosseous extension of tumor.  He has most recently been followed at the [facility] for medical care including multiple medical diagnoses.  Partial records from the [place] are available in the patient's paper chart.  On 06/22/2017 a CT revealed prostatic enlargement and pelvic lymphadenopathy.  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  This was unchanged compared to a CT from 2017. He was evaluated by urology.  A bone scan 06/28/2017 revealed diffuse bone metastases.  A prostate biopsy 06/30/2017 revealed Gleason 9 prostate cancer on the right side and Gleason 7 prostate cancer on the left side.  He is not aware of receiving any treatment for prostate cancer.    Past Medical History:  Diagnosis Date  . Arthritis   . Back pain   . Bipolar disorder ([place])   . Cancer [facility])    Metastatic prostate cancer  . CHF (congestive heart failure) (HCC)    takes Furosemide daily  . Chronic bronchitis ([place])    "get it just about q yr" (11/26/2014)  . COPD (chronic obstructive pulmonary disease) ([place])    Duoneb daily as needed.Inhaler daily  . Coronary artery disease    takes Plavix daily  .  Peripheral vascular disease      .  Peripheral neuropathy   . Dizziness   . Emphysema (subcutaneous) (surgical) resulting from a procedure   . Enlarged prostate    takes FLomax daily  . GERD  (gastroesophageal reflux disease)    takes Protonix daily  . History of colon polyps    benign  . History of MRSA infection 2013  . History of shingles   . Hyperlipidemia    takes Simvastatin daily  . Hypertension    takes Metoprolol daily as well as Lisinopril  . Myocardial infarction ([place]) 2014  . Nocturia   . Peripheral edema   . Pneumonia    Nov 2016  . S    with exertion  . Type II diabetes mellitus (HCC)    takes Novolog and Lantus daily.Average fasting blood sugar runs 120-130  :  Past Surgical History:  Procedure Laterality Date  . COLONOSCOPY    . CORONARY ARTERY BYPASS GRAFT  2014   at [place]; "CABG X3"  . ESOPHAGOGASTRODUODENOSCOPY N/A 12/21/2014   Procedure: ESOPHAGOGASTRODUODENOSCOPY (EGD);  Surgeon: Ladene Artist, MD;  Location: [facility] ENDOSCOPY;  Service: Endoscopy;  Laterality: N/A;  . INGUINAL HERNIA REPAIR Left    with mesh  . KNEE SURGERY Right   . MANDIBLE FRACTURE SURGERY  70's  . TONSILLECTOMY    :.  Lower extremity revascularization procedure at [facility] November 2018   Current Facility-Administered Medications:  .  acetaminophen (TYLENOL) tablet 650 mg, 650 mg, Oral, Q6H PRN **OR** acetaminophen (TYLENOL) suppository 650 mg, 650 mg, Rectal, Q6H PRN, Jani Gravel, MD .  aspirin EC tablet 81 mg, 81 mg, Oral, Daily, Jani Gravel, MD, 81 mg  at 07/24/17 0830 .  atorvastatin (LIPITOR) tablet 40 mg, 40 mg, Oral, q1800, Jani Gravel, MD, 40 mg at 07/23/17 1726 .  azithromycin (ZITHROMAX) 500 mg in sodium chloride 0.9 % 250 mL IVPB, 500 mg, Intravenous, Daily, Jani Gravel, MD, Stopped at 07/24/17 1034 .  furosemide (LASIX) tablet 40 mg, 40 mg, Oral, Daily, Jani Gravel, MD, 40 mg at 07/24/17 0831 .  gabapentin (NEURONTIN) capsule 300 mg, 300 mg, Oral, TID, Jani Gravel, MD, 300 mg at 07/23/17 2303 .  glipiZIDE (GLUCOTROL) tablet 2.5 mg, 2.5 mg, Oral, BID AC, Jani Gravel, MD, 2.5 mg at 07/23/17 1726 .  heparin injection 5,000 Units, 5,000 Units, Subcutaneous, Q8H,  [place], Darnell Level, RPH .  insulin aspart (novoLOG) injection 0-20 Units, 0-20 Units, Subcutaneous, TID WC, Velvet Bathe, MD, 15 Units at 07/24/17 1201 .  insulin aspart (novoLOG) injection 0-5 Units, 0-5 Units, Subcutaneous, QHS, Jani Gravel, MD, 4 Units at 07/23/17 2303 .  insulin glargine (LANTUS) injection 40 Units, 40 Units, Subcutaneous, Loma Sousa, MD, 40 Units at 07/23/17 2303 .  ipratropium-albuterol (DUONEB) 0.5-2.5 (3) MG/3ML nebulizer solution 3 mL, 3 mL, Nebulization, Q6H PRN, Jani Gravel, MD .  ipratropium-albuterol (DUONEB) 0.5-2.5 (3) MG/3ML nebulizer solution 3 mL, 3 mL, Nebulization, TID, Velvet Bathe, MD, 3 mL at 07/23/17 2100 .  losartan (COZAAR) tablet 25 mg, 25 mg, Oral, Daily, Jani Gravel, MD, 25 mg at 07/24/17 1035 .  metoprolol succinate (TOPROL-XL) 24 hr tablet 50 mg, 50 mg, Oral, Daily, Jani Gravel, MD, 50 mg at 07/24/17 0831 .  mometasone-formoterol (DULERA) 200-5 MCG/ACT inhaler 2 puff, 2 puff, Inhalation, BID, Jani Gravel, MD, 2 puff at 07/23/17 2100 .  morphine 2 MG/ML injection 1 mg, 1 mg, Intravenous, Q4H PRN, Jani Gravel, MD, 1 mg at 07/23/17 0308 .  oxyCODONE (Oxy IR/ROXICODONE) immediate release tablet 10 mg, 10 mg, Oral, Q6H PRN, Velvet Bathe, MD .  predniSONE (DELTASONE) tablet 60 mg, 60 mg, Oral, Q breakfast, Jani Gravel, MD, 60 mg at 07/24/17 0654 .  QUEtiapine (SEROQUEL) tablet 200 mg, 200 mg, Oral, QHS, Jani Gravel, MD, 200 mg at 07/23/17 2304 .  tamsulosin (FLOMAX) capsule 0.8 mg, 0.8 mg, Oral, Daily, Jani Gravel, MD, 0.8 mg at 07/24/17 0831:  . aspirin EC  81 mg Oral Daily  . atorvastatin  40 mg Oral q1800  . furosemide  40 mg Oral Daily  . gabapentin  300 mg Oral TID  . glipiZIDE  2.5 mg Oral BID AC  . heparin injection (subcutaneous)  5,000 Units Subcutaneous Q8H  . insulin aspart  0-20 Units Subcutaneous TID WC  . insulin aspart  0-5 Units Subcutaneous QHS  . insulin glargine  40 Units Subcutaneous QHS  . ipratropium-albuterol  3 mL  Nebulization TID  . losartan  25 mg Oral Daily  . metoprolol succinate  50 mg Oral Daily  . mometasone-formoterol  2 puff Inhalation BID  . predniSONE  60 mg Oral Q breakfast  . QUEtiapine  200 mg Oral QHS  . tamsulosin  0.8 mg Oral Daily  :  No Known Allergies:  FH: He reports an uncle and aunt died of lung cancer and were smokers.  SOCIAL HISTORY:  Review of Systems:   Positives include: Progressive back pain over several weeks, chronic foot numbness, nocturia  A complete ROS was otherwise negative.   Physical Exam:  Blood pressure (!) 130/58, pulse 89, temperature 98 F (36.7 C), temperature source Oral, resp. rate 18, height 5\' 10"  (1.778 m), weight 183 lb  6.4 oz (83.2 kg), SpO2 97 %.  HEENT: Edentulous, neck without mass Lungs: Bilateral expiratory wheeze, distant breath sounds Cardiac: Regular rate and rhythm Abdomen: No hepatosplenomegaly, nontender GU: Uncircumcised male, testes without mass Vascular: No leg edema Lymph nodes: No cervical, supraclavicular, axillary, or inguinal nodes Neurologic: Alert, follows commands, the motor exam is grossly intact in the lower extremities bilaterally Skin: Multiple tattoos, no rash Musculoskeletal: No tenderness at the lumbar spine, sacrum, or posterior iliac  LABS:  Recent Labs    07/22/17 2014  WBC 6.2  HGB 14.6  HCT 40.7  PLT 163    Recent Labs    07/22/17 2014 07/23/17 0619  NA 136 137  K 3.8 4.0  CL 98* 100*  CO2 27 25  GLUCOSE 166* 384*  BUN 21* 23*  CREATININE 1.28* 1.37*  CALCIUM 8.7* 8.5*      RADIOLOGY:  Dg Chest 2 View  Result Date: 07/22/2017 CLINICAL DATA:  Cough and shortness of breath in a patient with a history of COPD. EXAM: CHEST  2 VIEW COMPARISON:  PA and lateral chest 02/10/2017 and 12/22/2014. FINDINGS: The lungs are emphysematous. No consolidative process, pneumothorax or effusion. There is cardiomegaly. The patient is status post CABG. Aortic atherosclerosis noted. IMPRESSION: No  acute disease. Cardiomegaly. COPD. Atherosclerosis. Electronically Signed   By: Inge Rise M.D.   On: 07/22/2017 21:13   Ct Abdomen Pelvis W Contrast  Result Date: 07/22/2017 CLINICAL DATA:  Initial evaluation for metastatic prostate cancer, difficulty with pain management. Back pain. EXAM: CT ABDOMEN AND PELVIS WITH CONTRAST TECHNIQUE: Multidetector CT imaging of the abdomen and pelvis was performed using the standard protocol following bolus administration of intravenous contrast. CONTRAST:  127mL ISOVUE-300 IOPAMIDOL (ISOVUE-300) INJECTION 61% COMPARISON:  None. FINDINGS: CT ABDOMEN AND PELVIS FINDINGS Lower chest: Scattered linear atelectasis present within the visualized lung bases. Visualized lung bases are otherwise clear. Hepatobiliary: Liver demonstrates a normal contrast enhanced appearance. Gallbladder within normal limits. For NG and capped noted. Minimal intrahepatic biliary dilatation noted, of uncertain clinical significance. Pancreas: Pancreas within normal limits. Spleen: Subcentimeter hypodensity noted within the medial spleen, of doubtful significance. Spleen otherwise unremarkable. Adrenals/Urinary Tract: Adrenal glands are normal. Kidneys equal in size with symmetric enhancement. The subcentimeter hypodensity at the lower pole of the left kidney noted, too small the characterize, but statistically likely reflects a small cyst. No nephrolithiasis, hydronephrosis, or focal enhancing renal mass. No hydroureter. Moderate distension of the urinary bladder noted. Bladder otherwise unremarkable. Stomach/Bowel: Stomach within normal limits. No evidence for bowel obstruction. Appendix within normal limits. No acute inflammatory changes seen about the bowels. Vascular/Lymphatic: Moderate aorto bi-iliac atherosclerotic disease. No aneurysm. Mesenteric vessels patent proximally. No adenopathy. Reproductive: Prostate within normal limits. No appreciable prostatic mass. Other: No free air or fluid.  Musculoskeletal: Diffuse osseous metastases seen throughout the pelvis and spine. No appreciable pathologic fracture. CT LUMBAR SPINE FINDINGS: Segmentation: Is normal segmentation. Lowest well-formed disc labeled the L5-S1 level. Alignment: Vertebral bodies normally aligned with preservation of the normal lumbar lordosis. No listhesis. Vertebrae: Diffuse abnormal sclerotic lesion seen throughout the visualized osseous structures, consistent with widespread osseous metastases. No pathologic fracture. No significant extra osseous extension of tumor. Paraspinal and other soft tissues: Paraspinous soft tissues demonstrate no acute abnormality. Disc levels: Diffuse congenital shortening of the pedicles noted. L1-2: Diffuse disc bulge with disc desiccation. Mild bilateral facet arthrosis. Mild spinal stenosis, largely due to short pedicles. Foramina remain patent. L2-3: Mild diffuse disc bulge. Bilateral facet hypertrophy. Resultant mild spinal stenosis.  Mild bilateral L2 foraminal narrowing. L3-4: Diffuse disc bulge. Moderate bilateral facet hypertrophy. Changes superimposed on short pedicles result in moderate to advance spinal stenosis. Moderate to advanced bilateral L3 foraminal stenosis, right worse than left. L4-5: Diffuse disc bulge. Mild facet hypertrophy. Ligamentum flavum calcification. Changes superimposed on short pedicles results in moderate to severe spinal stenosis. Severe bilateral L4 foraminal narrowing. L5-S1: Diffuse disc bulge with intervertebral disc space narrowing. Reactive endplate changes. Mild bilateral facet hypertrophy. No significant spinal stenosis. Moderate right with advanced left L5 foraminal narrowing. IMPRESSION: CT ABDOMEN AND PELVIS IMPRESSION: 1. No acute abnormality identified within the abdomen and pelvis. 2. 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 No pathologic fracture. 3. Moderate aorto bi-iliac atherosclerotic disease.  No aneurysm. CT LUMBAR SPINE  IMPRESSION: 1. 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 No associated pathologic fracture. No significant extra osseous extension of tumor. 2. 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 3. Multifactorial foraminal narrowing as above, moderate to advanced at L3-4 through L5-S1. Electronically Signed   By: Jeannine Boga M.D.   On: 07/22/2017 22:22   Ct L-spine No Charge  Result Date: 07/22/2017 CLINICAL DATA:  Initial evaluation for metastatic prostate cancer, difficulty with pain management. Back pain. EXAM: CT ABDOMEN AND PELVIS WITH CONTRAST TECHNIQUE: Multidetector CT imaging of the abdomen and pelvis was performed using the standard protocol following bolus administration of intravenous contrast. CONTRAST:  140mL ISOVUE-300 IOPAMIDOL (ISOVUE-300) INJECTION 61% COMPARISON:  None. FINDINGS: CT ABDOMEN AND PELVIS FINDINGS Lower chest: Scattered linear atelectasis present within the visualized lung bases. Visualized lung bases are otherwise clear. Hepatobiliary: Liver demonstrates a normal contrast enhanced appearance. Gallbladder within normal limits. For NG and capped noted. Minimal intrahepatic biliary dilatation noted, of uncertain clinical significance. Pancreas: Pancreas within normal limits. Spleen: Subcentimeter hypodensity noted within the medial spleen, of doubtful significance. Spleen otherwise unremarkable. Adrenals/Urinary Tract: Adrenal glands are normal. Kidneys equal in size with symmetric enhancement. The subcentimeter hypodensity at the lower pole of the left kidney noted, too small the characterize, but statistically likely reflects a small cyst. No nephrolithiasis, hydronephrosis, or focal enhancing renal mass. No hydroureter. Moderate distension of the urinary bladder noted. Bladder otherwise unremarkable. Stomach/Bowel: Stomach within normal limits. No evidence for bowel obstruction. Appendix within normal limits. No acute  inflammatory changes seen about the bowels. Vascular/Lymphatic: Moderate aorto bi-iliac atherosclerotic disease. No aneurysm. Mesenteric vessels patent proximally. No adenopathy. Reproductive: Prostate within normal limits. No appreciable prostatic mass. Other: No free air or fluid. Musculoskeletal: Diffuse osseous metastases seen throughout the pelvis and spine. No appreciable pathologic fracture. CT LUMBAR SPINE FINDINGS: Segmentation: Is normal segmentation. Lowest well-formed disc labeled the L5-S1 level. Alignment: Vertebral bodies normally aligned with preservation of the normal lumbar lordosis. No listhesis. Vertebrae: Diffuse abnormal sclerotic lesion seen throughout the visualized osseous structures, consistent with widespread osseous metastases. No pathologic fracture. No significant extra osseous extension of tumor. Paraspinal and other soft tissues: Paraspinous soft tissues demonstrate no acute abnormality. Disc levels: Diffuse congenital shortening of the pedicles noted. L1-2: Diffuse disc bulge with disc desiccation. Mild bilateral facet arthrosis. Mild spinal stenosis, largely due to short pedicles. Foramina remain patent. L2-3: Mild diffuse disc bulge. Bilateral facet hypertrophy. Resultant mild spinal stenosis. Mild bilateral L2 foraminal narrowing. L3-4: Diffuse disc bulge. Moderate bilateral facet hypertrophy. Changes superimposed on short pedicles result in moderate to advance spinal stenosis. Moderate to advanced bilateral L3 foraminal stenosis, right worse than left. L4-5: Diffuse disc bulge. Mild  facet hypertrophy. Ligamentum flavum calcification. Changes superimposed on short pedicles results in moderate to severe spinal stenosis. Severe bilateral L4 foraminal narrowing. L5-S1: Diffuse disc bulge with intervertebral disc space narrowing. Reactive endplate changes. Mild bilateral facet hypertrophy. No significant spinal stenosis. Moderate right with advanced left L5 foraminal narrowing.  IMPRESSION: CT ABDOMEN AND PELVIS IMPRESSION: 1. No acute abnormality identified within the abdomen and pelvis. 2. 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 No pathologic fracture. 3. Moderate aorto bi-iliac atherosclerotic disease.  No aneurysm. CT LUMBAR SPINE IMPRESSION: 1. 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 No associated pathologic fracture. No significant extra osseous extension of tumor. 2. 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 3. Multifactorial foraminal narrowing as above, moderate to advanced at L3-4 through L5-S1. Electronically Signed   By: Jeannine Boga M.D.   On: 07/22/2017 22:22   CT images reviewed  Assessment and Plan:   1.  Prostate cancer, status post a prostate biopsy at the [place] in February 2019- Gleason 9 on the right and Gleason 7 on the left  Bone scan 06/28/2017-diffuse bone metastases  CT abdomen/pelvis and lumbar spine 07/22/2017- diffuse bone metastases including metastatic disease to the lumbosacral spine  2.  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 GIST  3.  Pain secondary to prostate cancer involving the bones  4.  COPD  5.  Diabetes  6.  Peripheral vascular disease  7.  History of coronary artery disease  8.  Bipolar disorder   Austin Stephenson has been diagnosed with prostate cancer.  The clinical presentation and imaging studies are consistent with a diagnosis of metastatic prostate cancer.  It does not appear that he has received any treatment for prostate cancer.  I explained the high chance of a clinical response with hormonal therapy in untreated patients with stage IV prostate cancer.  No therapy will be curative.  He appears to be symptomatic with pain secondary to bone metastases.  He could potentially be a candidate for hospice care based on multiple comorbid conditions, but not from the prostate cancer at present.  Mr.  Mariah Stephenson reports improved pain since hospital admission, potentially related to prednisone.  He has not required many doses of morphine.  Recommendations: 1.  Check PSA 2.  Hormonal therapy for prostate cancer if the PSA returns markedly elevated.  I favor initial treatment with degarelix 3.  Obtain complete notes from the [facility], has he received treatment for prostate cancer? 4.  Continue oxycodone for pain 5.  Outpatient follow-up will be scheduled at the Cancer center here if he remains in [facility], MD 07/24/2017, 4:00 PM

## 2017-07-24 NOTE — Consult Note (Signed)
Consultation Note Date: 07/24/2017   Patient Name: Austin Stephenson  DOB: May 17, 1941  MRN: [id number]  Age / Sex: 77 y.o., male  PCP: [facility] Referring Physician: Velvet Bathe, MD  Reason for Consultation: Establishing goals of care and Psychosocial/spiritual support  HPI/Patient Profile: 77 y.o. male  admitted on 07/22/2017 with  h/o dm2, hypertension, hyperlipidemia, Copd, CHF (EF 40-45%)  Moderate AR, mild MR, ,  prostate cancer (metastatic), apparetnly c/o back pain.  Without radiation.  Has been getting worse for the past 2 weeks and therefore he presented to ED,  Dyspnea was a secondary complain.   Apparently the patient has had all of his medical care under the [place], according to support person at the bedside the patient has not had a PSA checked in 4 years.  In Ed,  CT abd/ pelvis: CT ABDOMEN AND PELVIS IMPRESSION:  1. No acute abnormality identified within the abdomen and pelvis. 2. 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 No pathologic fracture. 3. Moderate aorto bi-iliac atherosclerotic disease. No aneurysm.  CT LUMBAR SPINE IMPRESSION:  1. 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 No associated pathologic fracture. No significant extra osseous extension of tumor. 2. 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 3. Multifactorial foraminal narrowing as above, moderate to advanced at L3-4 through L5-S1.  According to a friend/social worker who is at his bedside the patient lives alone with no social support.  Gwenyth Allegra phone #[phone] social worker with the [facility] has known this patient for several years and continues to help as she can.  Currently the patient finds himself in a situation of metastatic prostate cancer facing treatment option decisions, advance directive  decisions, and anticipatory care needs.   Clinical Assessment and Goals of Care:  This NP Wadie Lessen reviewed medical records, received report from team, assessed the patient and then meet at the patient's bedside along with his main support person a Education officer, museum from [facility] to discuss diagnosis, prognosis, [place], EOL wishes disposition and options.  Patient has not had any oncologic specialist weigh in on his diagnosis, prognosis and treatment options up to this point.  He needs this input in order to make educated decisions regarding his situation.  Discussed with Dr Wendee Beavers and he will consult oncology   I did discuss with the patient my concern that his lack of social support is going to make this situation very difficult for him.    His bother in [place], [place] has express that maybe the patient can come and live with him.   I left a phone message for his brother and await call back.  Concept of Hospice and Palliative Care were discussed  A detailed discussion was had today regarding advanced directives.  Concepts specific to code status, artifical feeding and hydration, continued IV antibiotics and rehospitalization was had.  The difference between an aggressive medical intervention path  and a palliative comfort care path for this patient at this time was  had.  Values and goals of care important to patient and family were attempted to be elicited.     Questions and concerns addressed.   Family encouraged to call with questions or concerns.    PMT will continue to support holistically.   Tom Goulart/brother /HPOA phone [phone]     SUMMARY OF RECOMMENDATIONS    Code Status/Advance Care Planning:  DNR    Symptom Management:   Currently patient reports pain control with current medications.  We will need to consider disposition as it will affect prescribing for pain management.  Palliative Prophylaxis:   Bowel Regimen, Delirium Protocol, Frequent  Pain Assessment and Oral Care  Additional Recommendations (Limitations, Scope, Preferences):  Full Scope Treatment  Psycho-social/Spiritual:   Desire for further Chaplaincy support:yes  Additional Recommendations: Education on Hospice  Prognosis:   Unable to determine  Discharge Planning: To Be Determined      Primary Diagnoses: Present on Admission: . COPD exacerbation ([place]) . Acute on chronic diastolic CHF (congestive heart failure) ([place]) . DM (diabetes mellitus), type 2, uncontrolled, with renal complications ([place])   I have reviewed the medical record, interviewed the patient and family, and examined the patient. The following aspects are pertinent.  Past Medical History:  Diagnosis Date  . Arthritis   . Back pain   . Bipolar disorder ([place])   . Cancer [facility])    Metastatic prostate cancer  . CHF (congestive heart failure) ([facility])    takes Furosemide daily  . Chronic bronchitis ([place])    "get it just about q yr" (11/26/2014)  . COPD (chronic obstructive pulmonary disease) ([place])    Duoneb daily as needed.Inhaler daily  . Coronary artery disease    takes Plavix daily  . Cough    Tessalon daily and Mucinex daily as needed.Finishing up ZPAk today and Prednisone on 06/17/15  . Cough   . Dizziness   . Emphysema (subcutaneous) (surgical) resulting from a procedure   . Enlarged prostate    takes FLomax daily  . GERD (gastroesophageal reflux disease)    takes Protonix daily  . History of colon polyps    benign  . History of MRSA infection 2013  . History of shingles   . Hyperlipidemia    takes Simvastatin daily  . Hypertension    takes Metoprolol daily as well as Lisinopril  . Myocardial infarction ([place]) 2014  . Nocturia   . Peripheral edema   . Pneumonia    Nov 2016  . Shortness of breath dyspnea    with exertion  . Type II diabetes mellitus ([facility])    takes Novolog and Lantus daily.Average fasting blood sugar runs 120-130   Social History   Socioeconomic  History  . Marital status: Divorced    Spouse name: None  . Number of children: None  . Years of education: None  . Highest education level: None  Social Needs  . Financial resource strain: None  . Food insecurity - worry: None  . Food insecurity - inability: None  . Transportation needs - medical: None  . Transportation needs - non-medical: None  Occupational History  . None  Tobacco Use  . Smoking status: Current Every Day Smoker    Packs/day: 1.00    Years: 60.00    Pack years: 60.00    Types: Cigarettes  . Smokeless tobacco: Never Used  Substance and Sexual Activity  . Alcohol use: No    Comment: quit 15 yrs ago  . Drug use: Yes  . Sexual activity:  No  Other Topics Concern  . None  Social History Narrative  . None   Family History  Problem Relation Age of Onset  . Diabetes Father    Scheduled Meds: . aspirin EC  81 mg Oral Daily  . atorvastatin  40 mg Oral q1800  . furosemide  40 mg Oral Daily  . gabapentin  300 mg Oral TID  . glipiZIDE  2.5 mg Oral BID AC  . heparin injection (subcutaneous)  5,000 Units Subcutaneous Q8H  . insulin aspart  0-20 Units Subcutaneous TID WC  . insulin aspart  0-5 Units Subcutaneous QHS  . insulin glargine  40 Units Subcutaneous QHS  . ipratropium-albuterol  3 mL Nebulization TID  . losartan  25 mg Oral Daily  . metoprolol succinate  50 mg Oral Daily  . mometasone-formoterol  2 puff Inhalation BID  . predniSONE  60 mg Oral Q breakfast  . QUEtiapine  200 mg Oral QHS  . tamsulosin  0.8 mg Oral Daily   Continuous Infusions: . azithromycin Stopped (07/24/17 1034)   PRN Meds:.acetaminophen **OR** acetaminophen, ipratropium-albuterol, morphine injection, oxyCODONE Medications Prior to Admission:  Prior to Admission medications   Medication Sig Start Date End Date Taking? Authorizing Provider  aspirin 81 MG EC tablet Take 1 tablet (81 mg total) by mouth daily after breakfast. Patient taking differently: Take 81 mg by mouth daily.   12/22/14   Rogue Bussing, MD  budesonide-formoterol [facility]) 160-4.5 MCG/ACT inhaler Inhale 2 puffs into the lungs 2 (two) times daily.    [provider]  furosemide (LASIX) 40 MG tablet Take 40 mg by mouth daily.    [provider]  gabapentin (NEURONTIN) 300 MG capsule Take 300 mg by mouth 3 (three) times daily.    [provider]  glipiZIDE (GLUCOTROL) 5 MG tablet Take 2.5 mg by mouth 2 (two) times daily before a meal.    [provider]  glucose 4 GM chewable tablet Chew 1 tablet by mouth See admin instructions. Chew 1-4 tablet (4 -16 gm) by mouth<70 until blood sugar is increased and dizziness stops    [provider]  insulin glargine (LANTUS) 100 UNIT/ML injection Inject 0.2 mLs (20 Units total) into the skin at bedtime. Patient taking differently: Inject 40 Units into the skin at bedtime.  12/22/14   Rogue Bussing, MD  ipratropium-albuterol (DUONEB) 0.5-2.5 (3) MG/3ML SOLN Take 3 mLs by nebulization every 6 (six) hours as needed. 12/22/14   Rogue Bussing, MD  losartan (COZAAR) 50 MG tablet Take 25 mg by mouth daily.    [provider]  metoprolol succinate (TOPROL-XL) 100 MG 24 hr tablet Take 50 mg by mouth daily. Take with or immediately following a meal.    [provider]  predniSONE (STERAPRED UNI-PAK 21 TAB) 10 MG (21) TBPK tablet Take 2 tablets for 7 days and then 1 tablet for 4 days and then stop 02/14/17   Nita Sells, MD  PRESCRIPTION MEDICATION Take 1 each by nebulization 4 (four) times daily as needed (shortness of breath/wheezing/cough).    [provider]  PRESCRIPTION MEDICATION Inject 25 Units into the skin at bedtime. Long acting insulin    [provider]  PRESCRIPTION MEDICATION Inject 15-20 Units into the skin See admin instructions. Short acting insulin - inject 15 units subcutaneously with breakfast & 20 units with lunch and supper    [provider]  QUEtiapine (SEROQUEL) 100 MG tablet Take 2 tablets (200 mg total) by mouth  at bedtime. Patient not taking: Reported on 02/10/2017 12/22/14   Rogue Bussing, MD  simvastatin (ZOCOR) 80 MG tablet Take 0.5 tablets (40 mg total) by mouth daily. Patient taking differently: Take 40 mg by mouth at bedtime.  12/22/14   Rogue Bussing, MD  tamsulosin (FLOMAX) 0.4 MG CAPS capsule Take 0.8 mg by mouth daily.     [provider]  traMADol (ULTRAM) 50 MG tablet Take 50 mg by mouth every 8 (eight) hours as needed (pain).    [provider]   No Known Allergies Review of Systems  Neurological: Positive for weakness.    Physical Exam  Constitutional: He is oriented to person, place, and time. He appears well-developed.  Cardiovascular: Normal rate, regular rhythm and normal heart sounds.  Pulmonary/Chest: Effort normal.  Abdominal: Soft. Normal appearance.  Neurological: He is alert and oriented to person, place, and time.  Skin: Skin is warm and dry.    Vital Signs: BP (!) 130/58 (BP Location: Right Arm)   Pulse 89   Temp 98 F (36.7 C) (Oral)   Resp 18   Ht 5\' 10"  (1.778 m)   Wt 83.2 kg (183 lb 6.4 oz)   SpO2 97%   BMI 26.32 kg/m  Pain Assessment: No/denies pain   Pain Score: 0-No pain   SpO2: SpO2: 97 % O2 Device:SpO2: 97 % O2 Flow Rate: .O2 Flow Rate (L/min): 2 L/min  IO: Intake/output summary:   Intake/Output Summary (Last 24 hours) at 07/24/2017 1459 Last data filed at 07/24/2017 1354 Gross per 24 hour  Intake 1440 ml  Output 875 ml  Net 565 ml    LBM: Last BM Date: 07/23/17 Baseline Weight: Weight: 82.8 kg (182 lb 9.6 oz) Most recent weight: Weight: 83.2 kg (183 lb 6.4 oz)     Palliative Assessment/Data:  50% at best   Discussed with Dr Wendee Beavers  Time In: 1415 Time Out: 1530 Time Total: 75 minutes Greater than 50%  of this time was spent counseling and coordinating care related to the above assessment and plan.  Signed by: Wadie Lessen, NP   Please contact Palliative Medicine Team phone at [phone] for questions and concerns.  For individual provider: See Shea Evans

## 2017-07-24 NOTE — Progress Notes (Signed)
Visited with patient to complete HCPOA.  Completed with witnesses.  Copy placed on patient's chart and original copy placed on chart to be given to patient at discharge.    07/24/17 1205  Clinical Encounter Type  Visited With Patient;Health care provider  Visit Type Initial;Spiritual support  Spiritual Encounters  Spiritual Needs Literature (Advaned Directive)  Advance Directives (For Healthcare)  Does Patient Have a Medical Advance Directive? Yes  Type of Paramedic of [place]

## 2017-07-24 NOTE — Progress Notes (Signed)
Patient refusing to take gabapentin.

## 2017-07-24 NOTE — Progress Notes (Signed)
PROGRESS NOTE    Austin Stephenson  KPT:[id number] DOB: Jan 10, 1941 DOA: 07/22/2017 PCP: [facility]    Brief Narrative:   77 y.o. male, w h/o dm2, hypertension, hyperlipidemia, Copd, CHF (EF 40-45%)  Moderate AR, mild MR, ,  prostate cancer (metastatic), apparetnly c/o back pain.  Without radiation.  Has been getting worse for the past 2 weeks and therefore he presented to ED. Discussed with his Social worker who reports that prior scan show diffuse metastasis which includes lumbar spine, scapula, and skull.  Pt is interested in hospice services  Assessment & Plan:   Principal Problem:   Prostate cancer ([place]) - Diffuse spread per my discussion with Education officer, museum. She has left prior scan on paper chart which I will review. According to my discussion with her and the information she has shared with me patient would best be served with hospice services. Will consult palliative care team to assist with goals of care, symptom management, and hospice referral. - Similarly I have consulted radiation oncology to assist with any recommendations that will help ease back pain given patient's metastases to spine  Active Problems:   DM (diabetes mellitus), type 2, uncontrolled, with renal complications (HCC)   Acute on chronic diastolic CHF (congestive heart failure) (HCC)     Back pain   Cancer associated pain    COPD  -   Patient currently has no exacerbation and actually becomes annoyed if you talk to him about his breathing situation as he reports it as well. Continue current regimen and supplemental oxygen as needed  DVT prophylaxis: add heparin for VTE prophylaxis Code Status: DNR Family Communication: d/c patient directly and his personal Education officer, museum Disposition Plan: consult palliative care for goc and hospice referal   Consultants:   Radiation oncology  Palliative care   Procedures: none   Antimicrobials: none   Subjective: Pt has no new complaints, no acute  issues overnight.  Objective: Vitals:   07/23/17 2101 07/23/17 2358 07/24/17 0432 07/24/17 0832  BP:  (!) 121/51 (!) 122/49 (!) 127/54  Pulse:  84 75 94  Resp:  20 20 18   Temp:  98.2 F (36.8 C) 98.3 F (36.8 C)   TempSrc:  Oral Oral   SpO2: 95% 95% 100% 97%  Weight:   83.2 kg (183 lb 6.4 oz)   Height:        Intake/Output Summary (Last 24 hours) at 07/24/2017 1334 Last data filed at 07/24/2017 1249 Gross per 24 hour  Intake 1680 ml  Output 650 ml  Net 1030 ml   Filed Weights   07/23/17 0248 07/24/17 0432  Weight: 82.8 kg (182 lb 9.6 oz) 83.2 kg (183 lb 6.4 oz)    Examination:  General exam: Appears calm and comfortable, in nad.  Respiratory system: Clear to auscultation. Respiratory effort normal. Equal chest rise.  Cardiovascular system: S1 & S2 heard, RRR. No JVD, murmurs, rubs, gallops or clicks. No pedal edema. Gastrointestinal system: Abdomen is nondistended, soft and nontender. No organomegaly or masses felt. Normal bowel sounds heard. Central nervous system: Alert and awake, answers questions appropriately. Display some problems with memory Extremities: warm and dry Skin: No rashes, lesions or ulcers, on limited exam. Psychiatry:  Mood & affect appropriate. Insight appears to be impaired.    Data Reviewed: I have personally reviewed following labs and imaging studies  CBC: Recent Labs  Lab 07/22/17 2014  WBC 6.2  NEUTROABS 4.5  HGB 14.6  HCT 40.7  MCV 92.1  PLT 163  Basic Metabolic Panel: Recent Labs  Lab 07/22/17 2014 07/23/17 0619  NA 136 137  K 3.8 4.0  CL 98* 100*  CO2 27 25  GLUCOSE 166* 384*  BUN 21* 23*  CREATININE 1.28* 1.37*  CALCIUM 8.7* 8.5*   GFR: Estimated Creatinine Clearance: 46.6 mL/min (A) (by C-G formula based on SCr of 1.37 mg/dL (H)). Liver Function Tests: Recent Labs  Lab 07/22/17 2014 07/23/17 0619  AST 29 25  ALT 15* 15*  ALKPHOS 1,047* 1,029*  BILITOT 1.1 0.9  PROT 6.5 6.5  ALBUMIN 3.6 3.3*   Recent Labs    Lab 07/22/17 2014  LIPASE 22   No results for input(s): AMMONIA in the last 168 hours. Coagulation Profile: No results for input(s): INR, PROTIME in the last 168 hours. Cardiac Enzymes: No results for input(s): CKTOTAL, CKMB, CKMBINDEX, TROPONINI in the last 168 hours. BNP (last 3 results) No results for input(s): PROBNP in the last 8760 hours. HbA1C: No results for input(s): HGBA1C in the last 72 hours. CBG: Recent Labs  Lab 07/23/17 2012 07/23/17 2357 07/24/17 0426 07/24/17 0739 07/24/17 1111  GLUCAP 321* 297* 225* 194* 304*   Lipid Profile: No results for input(s): CHOL, HDL, LDLCALC, TRIG, CHOLHDL, LDLDIRECT in the last 72 hours. Thyroid Function Tests: No results for input(s): TSH, T4TOTAL, FREET4, T3FREE, THYROIDAB in the last 72 hours. Anemia Panel: No results for input(s): VITAMINB12, FOLATE, FERRITIN, TIBC, IRON, RETICCTPCT in the last 72 hours. Sepsis Labs: No results for input(s): PROCALCITON, LATICACIDVEN in the last 168 hours.  Recent Results (from the past 240 hour(s))  MRSA PCR Screening     Status: None   Collection Time: 07/23/17  2:58 AM  Result Value Ref Range Status   MRSA by PCR NEGATIVE NEGATIVE Final    Comment:        The GeneXpert MRSA Assay (FDA approved for NASAL specimens only), is one component of a comprehensive MRSA colonization surveillance program. It is not intended to diagnose MRSA infection nor to guide or monitor treatment for MRSA infections. Performed at [facility], [place] [address] [place], [place] [address]          Radiology Studies: Dg Chest 2 View  Result Date: 07/22/2017 CLINICAL DATA:  Cough and shortness of breath in a patient with a history of COPD. EXAM: CHEST  2 VIEW COMPARISON:  PA and lateral chest 02/10/2017 and 12/22/2014. FINDINGS: The lungs are emphysematous. No consolidative process, pneumothorax or effusion. There is cardiomegaly. The patient is status post CABG. Aortic atherosclerosis noted.  IMPRESSION: No acute disease. Cardiomegaly. COPD. Atherosclerosis. Electronically Signed   By: Inge Rise M.D.   On: 07/22/2017 21:13   Ct Abdomen Pelvis W Contrast  Result Date: 07/22/2017 CLINICAL DATA:  Initial evaluation for metastatic prostate cancer, difficulty with pain management. Back pain. EXAM: CT ABDOMEN AND PELVIS WITH CONTRAST TECHNIQUE: Multidetector CT imaging of the abdomen and pelvis was performed using the standard protocol following bolus administration of intravenous contrast. CONTRAST:  137mL ISOVUE-300 IOPAMIDOL (ISOVUE-300) INJECTION 61% COMPARISON:  None. FINDINGS: CT ABDOMEN AND PELVIS FINDINGS Lower chest: Scattered linear atelectasis present within the visualized lung bases. Visualized lung bases are otherwise clear. Hepatobiliary: Liver demonstrates a normal contrast enhanced appearance. Gallbladder within normal limits. For NG and capped noted. Minimal intrahepatic biliary dilatation noted, of uncertain clinical significance. Pancreas: Pancreas within normal limits. Spleen: Subcentimeter hypodensity noted within the medial spleen, of doubtful significance. Spleen otherwise unremarkable. Adrenals/Urinary Tract: Adrenal glands are normal. Kidneys equal in  size with symmetric enhancement. The subcentimeter hypodensity at the lower pole of the left kidney noted, too small the characterize, but statistically likely reflects a small cyst. No nephrolithiasis, hydronephrosis, or focal enhancing renal mass. No hydroureter. Moderate distension of the urinary bladder noted. Bladder otherwise unremarkable. Stomach/Bowel: Stomach within normal limits. No evidence for bowel obstruction. Appendix within normal limits. No acute inflammatory changes seen about the bowels. Vascular/Lymphatic: Moderate aorto bi-iliac atherosclerotic disease. No aneurysm. Mesenteric vessels patent proximally. No adenopathy. Reproductive: Prostate within normal limits. No appreciable prostatic mass. Other: No free  air or fluid. Musculoskeletal: Diffuse osseous metastases seen throughout the pelvis and spine. No appreciable pathologic fracture. CT LUMBAR SPINE FINDINGS: Segmentation: Is normal segmentation. Lowest well-formed disc labeled the L5-S1 level. Alignment: Vertebral bodies normally aligned with preservation of the normal lumbar lordosis. No listhesis. Vertebrae: Diffuse abnormal sclerotic lesion seen throughout the visualized osseous structures, consistent with widespread osseous metastases. No pathologic fracture. No significant extra osseous extension of tumor. Paraspinal and other soft tissues: Paraspinous soft tissues demonstrate no acute abnormality. Disc levels: Diffuse congenital shortening of the pedicles noted. L1-2: Diffuse disc bulge with disc desiccation. Mild bilateral facet arthrosis. Mild spinal stenosis, largely due to short pedicles. Foramina remain patent. L2-3: Mild diffuse disc bulge. Bilateral facet hypertrophy. Resultant mild spinal stenosis. Mild bilateral L2 foraminal narrowing. L3-4: Diffuse disc bulge. Moderate bilateral facet hypertrophy. Changes superimposed on short pedicles result in moderate to advance spinal stenosis. Moderate to advanced bilateral L3 foraminal stenosis, right worse than left. L4-5: Diffuse disc bulge. Mild facet hypertrophy. Ligamentum flavum calcification. Changes superimposed on short pedicles results in moderate to severe spinal stenosis. Severe bilateral L4 foraminal narrowing. L5-S1: Diffuse disc bulge with intervertebral disc space narrowing. Reactive endplate changes. Mild bilateral facet hypertrophy. No significant spinal stenosis. Moderate right with advanced left L5 foraminal narrowing. IMPRESSION: CT ABDOMEN AND PELVIS IMPRESSION: 1. No acute abnormality identified within the abdomen and pelvis. 2. 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 No pathologic fracture. 3. Moderate aorto bi-iliac atherosclerotic disease.  No aneurysm. CT  LUMBAR SPINE IMPRESSION: 1. 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 No associated pathologic fracture. No significant extra osseous extension of tumor. 2. 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 3. Multifactorial foraminal narrowing as above, moderate to advanced at L3-4 through L5-S1. Electronically Signed   By: Jeannine Boga M.D.   On: 07/22/2017 22:22   Ct L-spine No Charge  Result Date: 07/22/2017 CLINICAL DATA:  Initial evaluation for metastatic prostate cancer, difficulty with pain management. Back pain. EXAM: CT ABDOMEN AND PELVIS WITH CONTRAST TECHNIQUE: Multidetector CT imaging of the abdomen and pelvis was performed using the standard protocol following bolus administration of intravenous contrast. CONTRAST:  174mL ISOVUE-300 IOPAMIDOL (ISOVUE-300) INJECTION 61% COMPARISON:  None. FINDINGS: CT ABDOMEN AND PELVIS FINDINGS Lower chest: Scattered linear atelectasis present within the visualized lung bases. Visualized lung bases are otherwise clear. Hepatobiliary: Liver demonstrates a normal contrast enhanced appearance. Gallbladder within normal limits. For NG and capped noted. Minimal intrahepatic biliary dilatation noted, of uncertain clinical significance. Pancreas: Pancreas within normal limits. Spleen: Subcentimeter hypodensity noted within the medial spleen, of doubtful significance. Spleen otherwise unremarkable. Adrenals/Urinary Tract: Adrenal glands are normal. Kidneys equal in size with symmetric enhancement. The subcentimeter hypodensity at the lower pole of the left kidney noted, too small the characterize, but statistically likely reflects a small cyst. No nephrolithiasis, hydronephrosis, or focal enhancing renal mass. No hydroureter. Moderate distension of the  urinary bladder noted. Bladder otherwise unremarkable. Stomach/Bowel: Stomach within normal limits. No evidence for bowel obstruction. Appendix within normal limits.  No acute inflammatory changes seen about the bowels. Vascular/Lymphatic: Moderate aorto bi-iliac atherosclerotic disease. No aneurysm. Mesenteric vessels patent proximally. No adenopathy. Reproductive: Prostate within normal limits. No appreciable prostatic mass. Other: No free air or fluid. Musculoskeletal: Diffuse osseous metastases seen throughout the pelvis and spine. No appreciable pathologic fracture. CT LUMBAR SPINE FINDINGS: Segmentation: Is normal segmentation. Lowest well-formed disc labeled the L5-S1 level. Alignment: Vertebral bodies normally aligned with preservation of the normal lumbar lordosis. No listhesis. Vertebrae: Diffuse abnormal sclerotic lesion seen throughout the visualized osseous structures, consistent with widespread osseous metastases. No pathologic fracture. No significant extra osseous extension of tumor. Paraspinal and other soft tissues: Paraspinous soft tissues demonstrate no acute abnormality. Disc levels: Diffuse congenital shortening of the pedicles noted. L1-2: Diffuse disc bulge with disc desiccation. Mild bilateral facet arthrosis. Mild spinal stenosis, largely due to short pedicles. Foramina remain patent. L2-3: Mild diffuse disc bulge. Bilateral facet hypertrophy. Resultant mild spinal stenosis. Mild bilateral L2 foraminal narrowing. L3-4: Diffuse disc bulge. Moderate bilateral facet hypertrophy. Changes superimposed on short pedicles result in moderate to advance spinal stenosis. Moderate to advanced bilateral L3 foraminal stenosis, right worse than left. L4-5: Diffuse disc bulge. Mild facet hypertrophy. Ligamentum flavum calcification. Changes superimposed on short pedicles results in moderate to severe spinal stenosis. Severe bilateral L4 foraminal narrowing. L5-S1: Diffuse disc bulge with intervertebral disc space narrowing. Reactive endplate changes. Mild bilateral facet hypertrophy. No significant spinal stenosis. Moderate right with advanced left L5 foraminal  narrowing. IMPRESSION: CT ABDOMEN AND PELVIS IMPRESSION: 1. No acute abnormality identified within the abdomen and pelvis. 2. 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 No pathologic fracture. 3. Moderate aorto bi-iliac atherosclerotic disease.  No aneurysm. CT LUMBAR SPINE IMPRESSION: 1. 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 No associated pathologic fracture. No significant extra osseous extension of tumor. 2. 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 3. Multifactorial foraminal narrowing as above, moderate to advanced at L3-4 through L5-S1. Electronically Signed   By: Jeannine Boga M.D.   On: 07/22/2017 22:22        Scheduled Meds: . aspirin EC  81 mg Oral Daily  . atorvastatin  40 mg Oral q1800  . furosemide  40 mg Oral Daily  . gabapentin  300 mg Oral TID  . glipiZIDE  2.5 mg Oral BID AC  . insulin aspart  0-20 Units Subcutaneous TID WC  . insulin aspart  0-5 Units Subcutaneous QHS  . insulin glargine  40 Units Subcutaneous QHS  . ipratropium-albuterol  3 mL Nebulization TID  . losartan  25 mg Oral Daily  . metoprolol succinate  50 mg Oral Daily  . mometasone-formoterol  2 puff Inhalation BID  . predniSONE  60 mg Oral Q breakfast  . QUEtiapine  200 mg Oral QHS  . tamsulosin  0.8 mg Oral Daily   Continuous Infusions: . azithromycin Stopped (07/24/17 1034)     LOS: 2 days    Time spent: > 35 minutes  Velvet Bathe, MD [facility] Pager [phone]  If 7PM-7AM, please contact night-coverage [email or web address] Password [email or web address] 07/24/2017, 1:34 PM

## 2017-07-24 NOTE — Progress Notes (Signed)
Pt has paper information from [facility] on chart which was seen by oncology.

## 2017-07-25 ENCOUNTER — Ambulatory Visit: Payer: Self-pay | Admitting: Radiation Oncology

## 2017-07-25 DIAGNOSIS — R159 Full incontinence of feces: Secondary | ICD-10-CM

## 2017-07-25 LAB — GLUCOSE, CAPILLARY
GLUCOSE-CAPILLARY: 109 mg/dL — AB (ref 65–99)
GLUCOSE-CAPILLARY: 137 mg/dL — AB (ref 65–99)
Glucose-Capillary: 115 mg/dL — ABNORMAL HIGH (ref 65–99)
Glucose-Capillary: 172 mg/dL — ABNORMAL HIGH (ref 65–99)

## 2017-07-25 NOTE — Progress Notes (Signed)
IP PROGRESS NOTE  Subjective:   Austin Stephenson reports improved pain when at rest.  He has increased lower back pain with ambulation.  He now gives a history of receiving a "shot "at the [place].   Objective: Vital signs in last 24 hours: Blood pressure (!) 103/40, pulse 93, temperature 98.4 F (36.9 C), temperature source Oral, resp. rate 18, height 5\' 10"  (1.778 m), weight 185 lb 1.6 oz (84 kg), SpO2 98 %.  Intake/Output from previous day: 03/04 0701 - 03/05 0700 In: 2446 [P.O.:2196; IV Piggyback:250] Out: 750 [Urine:750]  Physical Exam: Not performed today    Lab Results: Recent Labs    07/22/17 2014  WBC 6.2  HGB 14.6  HCT 40.7  PLT 163    BMET Recent Labs    07/22/17 2014 07/23/17 0619  NA 136 137  K 3.8 4.0  CL 98* 100*  CO2 27 25  GLUCOSE 166* 384*  BUN 21* 23*  CREATININE 1.28* 1.37*  CALCIUM 8.7* 8.5*   PSA on 07/24/2017: 16.12  Medications: I have reviewed the patient's current medications.  Assessment/Plan: 1.  Prostate cancer, status post a prostate biopsy at the [place] in February 2019- Gleason 9 on the right and Gleason 7 on the left  Bone scan 06/28/2017-diffuse bone metastases  CT abdomen/pelvis and lumbar spine 07/22/2017- diffuse bone metastases including metastatic disease to the lumbosacral spine  2.  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 GIST  3.  Pain secondary to prostate cancer involving the bones  4.  COPD  5.  Diabetes  6.  Peripheral vascular disease  7.  History of coronary artery disease  8.  Bipolar disorder   Austin Stephenson appears unchanged.  The PSA is significantly lower compared to when he was seen at the [place] last month.  He reports receiving a "shot "at the [place].  I suspect he was treated with Lupron or degarelix.  There is a partial note from Dr. Reece Agar in the paper chart.  I suspect the pain will slowly improve over the next several weeks if it is due to prostate cancer.  We need  to clarify whether he plans to remain in [place] and receive treatment here versus moved to [place].  I will contact his social worker today.  Recommendations: 1.  Increase ambulation as tolerated 2.  Obtain complete records from the [facility] regarding treatment of prostate cancer 3.  Try oral analgesics for pain 4.  Outpatient follow-up will be scheduled at the Cancer center if he decides to remain in [place].     LOS: 3 days   Betsy Coder, MD   07/25/2017, 9:17 AM

## 2017-07-25 NOTE — Progress Notes (Signed)
Patient states he does not want to take scheduled gabapentin at night because it "makes me crazy".

## 2017-07-25 NOTE — Progress Notes (Signed)
Patient ID: Austin Stephenson, male   DOB: [birth year], 77 y.o.   MRN: [id number]  This NP visited patient at the bedside as a follow up to  yesterday's [place].  Continued conversation with patient regarding diagnosis,  treatment option decisions, advanced directive decision and anticipatory care needs.  Disposition plan is at root of all of his decisions.  He lives alone with no social support, his brother lives in [place], [place] and is planning on bringing  him home with him to live.  He does wish to move forward with treatment for his metastatic prostate cancer.  SW from [facility] is helping with transfer of [facility] benefits.  I spoke to his brother and he is aware of the situation and future needs.  Questions and concerns addressed    Discussed with patient the importance of continued conversation with family and their  medical providers regarding overall plan of care and treatment options,  ensuring decisions are within the context of the patients values and GOCs.  Patient and family encouraged to call with questions or concerns   Time in 1700          Time out  1720   Total time spent on the unit was 20 minutes  Greater than 50% of the time was spent in counseling and coordination of care  Wadie Lessen NP  Palliative Medicine Team Team Phone # [phone] Pager [phone]

## 2017-07-25 NOTE — Progress Notes (Signed)
PROGRESS NOTE    Austin Stephenson  UXN:[id number] DOB: 07-01-1940 DOA: 07/22/2017 PCP: [facility]    Brief Narrative:   77 y.o. male, w h/o dm2, hypertension, hyperlipidemia, Copd, CHF (EF 40-45%)  Moderate AR, mild MR, ,  prostate cancer (metastatic), apparetnly c/o back pain.  Without radiation.  Has been getting worse for the past 2 weeks and therefore he presented to ED. Discussed with his Social worker who reports that prior scan show diffuse metastasis which includes lumbar spine, scapula, and skull.  Palliative was consulted for goals of care and symptom management as well as referral to hospice if appropriate. Patient reported to palliative care team that  Assessment & Plan:   Principal Problem:   Prostate cancer (HCC)Back pain/Cancer associated pain - I have consulted radiation oncology, oncology, and palliative care. Radiation oncology has signed off secondary to improvement with morphine and with plans to start hormone therapy. Oncology is currently on board and is currently gathering outside documents to assist with further recommendations regarding further treatment options the patient may have. Patient was seen at [facility] and plan is to obtain further records from there (orders placed). Depending on what has been done for the patient this will affect further recommendations from oncologist moving forward.  Active Problems:   DM (diabetes mellitus), type 2, uncontrolled, with renal complications ([place]) - Patient is currently on Lantus and sliding scale insulin  chronic diastolic CHF (congestive heart failure) (HCC) - Stable currently no complaints of increased work of breathing    COPD  - Stable currently continue current medication regimen  DVT prophylaxis: add heparin for VTE prophylaxis Code Status: DNR Family Communication: d/c patient directly and his personal Education officer, museum Disposition Plan: consult palliative care for goc and hospice  referal   Consultants:   Radiation oncology  Palliative care   Procedures: none   Antimicrobials: none   Subjective: No new complaints reported overnight.  Objective: Vitals:   07/24/17 2019 07/24/17 2254 07/25/17 0416 07/25/17 1326  BP:  97/64 (!) 103/40 116/62  Pulse: 91  93 95  Resp: 18  18 18   Temp:   98.4 F (36.9 C) 98.5 F (36.9 C)  TempSrc:   Oral Oral  SpO2: 97%  98% 98%  Weight:   84 kg (185 lb 1.6 oz)   Height:        Intake/Output Summary (Last 24 hours) at 07/25/2017 1518 Last data filed at 07/25/2017 1039 Gross per 24 hour  Intake 1876 ml  Output 525 ml  Net 1351 ml   Filed Weights   07/23/17 0248 07/24/17 0432 07/25/17 0416  Weight: 82.8 kg (182 lb 9.6 oz) 83.2 kg (183 lb 6.4 oz) 84 kg (185 lb 1.6 oz)    Examination:  General exam: Appears calm and comfortable, in nad.  Respiratory system: Clear to auscultation. Respiratory effort normal. Equal chest rise.  Cardiovascular system: S1 & S2 heard, RRR. No JVD, murmurs, rubs, gallops or clicks. No pedal edema. Gastrointestinal system: Abdomen is nondistended, soft and nontender. No organomegaly or masses felt. Normal bowel sounds heard. Central nervous system: Alert and awake, answers questions appropriately. Display some problems with memory Extremities: warm and dry Skin: No rashes, lesions or ulcers, on limited exam. Psychiatry:  Mood & affect appropriate. Insight appears to be impaired.  Data Reviewed: I have personally reviewed following labs and imaging studies  CBC: Recent Labs  Lab 07/22/17 2014  WBC 6.2  NEUTROABS 4.5  HGB 14.6  HCT 40.7  MCV  92.1  PLT 010   Basic Metabolic Panel: Recent Labs  Lab 07/22/17 2014 07/23/17 0619  NA 136 137  K 3.8 4.0  CL 98* 100*  CO2 27 25  GLUCOSE 166* 384*  BUN 21* 23*  CREATININE 1.28* 1.37*  CALCIUM 8.7* 8.5*   GFR: Estimated Creatinine Clearance: 46.6 mL/min (A) (by C-G formula based on SCr of 1.37 mg/dL (H)). Liver Function  Tests: Recent Labs  Lab 07/22/17 2014 07/23/17 0619  AST 29 25  ALT 15* 15*  ALKPHOS 1,047* 1,029*  BILITOT 1.1 0.9  PROT 6.5 6.5  ALBUMIN 3.6 3.3*   Recent Labs  Lab 07/22/17 2014  LIPASE 22   No results for input(s): AMMONIA in the last 168 hours. Coagulation Profile: No results for input(s): INR, PROTIME in the last 168 hours. Cardiac Enzymes: No results for input(s): CKTOTAL, CKMB, CKMBINDEX, TROPONINI in the last 168 hours. BNP (last 3 results) No results for input(s): PROBNP in the last 8760 hours. HbA1C: No results for input(s): HGBA1C in the last 72 hours. CBG: Recent Labs  Lab 07/24/17 1111 07/24/17 1627 07/24/17 2122 07/25/17 0745 07/25/17 1207  GLUCAP 304* 201* 159* 115* 109*   Lipid Profile: No results for input(s): CHOL, HDL, LDLCALC, TRIG, CHOLHDL, LDLDIRECT in the last 72 hours. Thyroid Function Tests: No results for input(s): TSH, T4TOTAL, FREET4, T3FREE, THYROIDAB in the last 72 hours. Anemia Panel: No results for input(s): VITAMINB12, FOLATE, FERRITIN, TIBC, IRON, RETICCTPCT in the last 72 hours. Sepsis Labs: No results for input(s): PROCALCITON, LATICACIDVEN in the last 168 hours.  Recent Results (from the past 240 hour(s))  MRSA PCR Screening     Status: None   Collection Time: 07/23/17  2:58 AM  Result Value Ref Range Status   MRSA by PCR NEGATIVE NEGATIVE Final    Comment:        The GeneXpert MRSA Assay (FDA approved for NASAL specimens only), is one component of a comprehensive MRSA colonization surveillance program. It is not intended to diagnose MRSA infection nor to guide or monitor treatment for MRSA infections. Performed at [facility], [place] [address], [place], [place] [address]          Radiology Studies: No results found.      Scheduled Meds: . aspirin EC  81 mg Oral Daily  . atorvastatin  40 mg Oral q1800  . furosemide  40 mg Oral Daily  . gabapentin  300 mg Oral TID  . glipiZIDE  2.5 mg Oral BID AC   . heparin injection (subcutaneous)  5,000 Units Subcutaneous Q8H  . insulin aspart  0-20 Units Subcutaneous TID WC  . insulin aspart  0-5 Units Subcutaneous QHS  . insulin glargine  40 Units Subcutaneous QHS  . ipratropium-albuterol  3 mL Nebulization TID  . losartan  25 mg Oral Daily  . metoprolol succinate  50 mg Oral Daily  . mometasone-formoterol  2 puff Inhalation BID  . predniSONE  60 mg Oral Q breakfast  . QUEtiapine  200 mg Oral QHS  . tamsulosin  0.8 mg Oral Daily   Continuous Infusions: . azithromycin Stopped (07/25/17 1000)     LOS: 3 days    Time spent: > 35 minutes  Velvet Bathe, MD [facility] Pager [phone]  If 7PM-7AM, please contact night-coverage [email or web address] Password [facility] 07/25/2017, 3:18 PM

## 2017-07-25 NOTE — Plan of Care (Signed)
  Education: Knowledge of General Education information will improve 07/25/2017 0236 - Adequate for Discharge by Tristan Schroeder, RN   Health Behavior/Discharge Planning: Ability to manage health-related needs will improve 07/25/2017 0236 - Adequate for Discharge by Tristan Schroeder, RN

## 2017-07-25 NOTE — Progress Notes (Signed)
Our service was planning to see this patient to discuss palliative radiotherapy to the Lumbar and Sacral spine. I was contacted prior to seeing the patient  by Dr. Benay Spice and the patient has had great pain improvement with morphine, and his plan is to start hormone therapy for Austin Stephenson. We will be available for consultation should his clinician picture change.      Carola Rhine, PAC

## 2017-07-26 LAB — GLUCOSE, CAPILLARY
GLUCOSE-CAPILLARY: 178 mg/dL — AB (ref 65–99)
Glucose-Capillary: 114 mg/dL — ABNORMAL HIGH (ref 65–99)
Glucose-Capillary: 124 mg/dL — ABNORMAL HIGH (ref 65–99)

## 2017-07-26 MED ORDER — OXYCODONE HCL ER 15 MG PO T12A: 15.0000 mg | EXTENDED_RELEASE_TABLET | Freq: Two times a day (BID) | ORAL | 0 refills | Status: AC

## 2017-07-26 MED ORDER — AZITHROMYCIN 500 MG PO TABS: 500.0000 mg | ORAL_TABLET | Freq: Every day | ORAL | 0 refills | Status: AC

## 2017-07-26 MED ORDER — PREDNISONE 20 MG PO TABS: ORAL_TABLET | ORAL | 0 refills | Status: AC

## 2017-07-26 MED ORDER — OXYCODONE HCL 5 MG PO TABS: 5.0000 mg | ORAL_TABLET | Freq: Four times a day (QID) | ORAL | 0 refills | Status: AC | PRN

## 2017-07-26 MED ORDER — AZITHROMYCIN 500 MG PO TABS: 500.0000 mg | ORAL_TABLET | Freq: Every day | ORAL | Status: DC

## 2017-07-26 NOTE — Clinical Social Work Note (Signed)
Clinical Social Work Assessment  Patient Details  Name: Austin Stephenson MRN: [id number] Date of Birth: 02/22/1941  Date of referral:  07/26/17               Reason for consult:  Facility Placement, Discharge Planning                Permission sought to share information with:  Facility Contact Representative Permission granted to share information::  Yes, Verbal Permission Granted  Name::        Agency::  SNF's  Relationship::     Contact Information:     Housing/Transportation Living arrangements for the past 2 months:  Apartment Source of Information:  Patient, Medical Team Patient Interpreter Needed:  None Criminal Activity/Legal Involvement Pertinent to Current Situation/Hospitalization:  No - Comment as needed Significant Relationships:  Siblings, Community Support Lives with:  Self Do you feel safe going back to the place where you live?  Yes Need for family participation in patient care:  Yes (Comment)  Care giving concerns:  PT recommending SNF once medically stable for discharge.   Social Worker assessment / plan:  CSW met with patient. No supports at bedside. CSW introduced role and explained that PT recommendations would be discussed. Patient is very agreeable to SNF placement due to bed bug situation at his apartment. He understands he cannot bring any of his belongings from home into the facility to prevent spread of bed bugs. PASARR under manual review. Patient cannot go to SNF until PASARR obtained. No further concerns. CSW encouraged patient to contact CSW as needed. CSW will continue to follow patient for support and facilitate discharge to SNF once medically stable.  Employment status:  Retired Insurance information:  VA Benefit, Medicare PT Recommendations:  Skilled Nursing Facility Information / Referral to community resources:  Skilled Nursing Facility  Patient/Family's Response to care:  Patient agreeable to SNF placement. Patient's brother and [facility]  social worker supportive and involved in patient's care. Patient appreciated social work intervention.  Patient/Family's Understanding of and Emotional Response to Diagnosis, Current Treatment, and Prognosis:  Patient has a good understanding of the reason for admission and his need for rehab prior to returning home. Patient appears happy with hospital care.  Emotional Assessment Appearance:  Appears stated age Attitude/Demeanor/Rapport:  Engaged, Gracious Affect (typically observed):  Accepting, Anxious, Pleasant Orientation:  Oriented to Self, Oriented to Place, Oriented to  Time, Oriented to Situation Alcohol / Substance use:  Never Used Psych involvement (Current and /or in the community):  No (Comment)  Discharge Needs  Concerns to be addressed:  Care Coordination Readmission within the last 30 days:  No Current discharge risk:  Dependent with Mobility, Lives alone Barriers to Discharge:  Awaiting State Approval ([place])    C , LCSW 07/26/2017, 12:35 PM  

## 2017-07-26 NOTE — Progress Notes (Signed)
IP PROGRESS NOTE  Subjective:   Mr. Mariah Milling reports improved pain at rest.  He has increased pain with ambulation.  The pain is at the lower back and pubic areas.  He reports an episode of fecal incontinence.   Objective: Vital signs in last 24 hours: Blood pressure (!) 100/53, pulse 89, temperature 97.7 F (36.5 C), temperature source Oral, resp. rate 18, height 5\' 10"  (1.778 m), weight 180 lb 14.4 oz (82.1 kg), SpO2 93 %.  Intake/Output from previous day: 03/05 0701 - 03/06 0700 In: 1000 [P.O.:750; IV Piggyback:250] Out: 300 [Urine:300]  Physical Exam:  Musculoskeletal:-Area of pain localizes to the upper sacrum and bilateral posterior iliac regions.  No tenderness at the pubic bone Neurologic: He moves legs without difficulty.    Lab Results: No results for input(s): WBC, HGB, HCT, PLT in the last 72 hours.  BMET No results for input(s): NA, K, CL, CO2, GLUCOSE, BUN, CREATININE, CALCIUM in the last 72 hours. PSA on 07/24/2017: 16.12  Medications: I have reviewed the patient's current medications.  Assessment/Plan: 1.  Prostate cancer, status post a prostate biopsy at the [place] in February 2019- Gleason 9 on the right and Gleason 7 on the left  Bone scan 06/28/2017-diffuse bone metastases  CT abdomen/pelvis and lumbar spine 07/22/2017- diffuse bone metastases including metastatic disease to the lumbosacral spine  2.  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 GIST  3.  Pain secondary to prostate cancer involving the bones  4.  COPD  5.  Diabetes  6.  Peripheral vascular disease  7.  History of coronary artery disease  8.  Bipolar disorder   Mr. Mariah Milling appears unchanged.  He continues to have significant pain with ambulation.  It is unclear whether the pain is related to metastatic prostate cancer, though it most likely is.  Hopefully the pain will improve with time.  He may be a candidate for palliative radiation if pain does not  improve over the next few weeks.  He should continue physical therapy.  I would consider an MRI of the lumbosacral spine if he is unable to ambulate or has recurrent incontinence.  I reviewed additional records from the [place].  He was diagnosed with metastatic prostate cancer by Dr. Reece Agar.  Treatment records are not available, though I suspect he has received a Lupron injection based on his description of an injection and the lower PSA here.  Discussed the case with his outpatient social worker yesterday.  Recommendations: 1.  Increase ambulation as tolerated, continue physical therapy 2.  Outpatient follow-up with Dr. Jacqualin Combes at the [facility] 3.  Continue oral analgesics as needed for pain 4.  Physical therapy, MRI of the lumbosacral spine if he is unable to ambulate 5.  Outpatient follow-up will be scheduled at the Cancer center if he decides to remain in [place].     LOS: 4 days   Betsy Coder, MD   07/26/2017, 2:22 PM

## 2017-07-26 NOTE — Clinical Social Work Note (Signed)
CSW facilitated patient discharge including contacting facility to confirm patient discharge plans. Patient declined to have CSW call family. Clinical information faxed to facility and family agreeable with plan. CSW arranged ambulance transport via [facility] to [facility] at 5:00 pm. RN to call report prior to discharge [phone] Room 124A).  CSW will sign off for now as social work intervention is no longer needed. Please consult [place] again if new needs arise.  Dayton Scrape, [place]

## 2017-07-26 NOTE — Plan of Care (Signed)
  Health Behavior/Discharge Planning: Ability to manage health-related needs will improve 07/26/2017 0236 - Progressing by Tristan Schroeder, RN   Education: Knowledge of General Education information will improve 07/26/2017 0236 - Progressing by Tristan Schroeder, RN   Clinical Measurements: Respiratory complications will improve 07/26/2017 0236 - Progressing by Tristan Schroeder, RN

## 2017-07-26 NOTE — Evaluation (Signed)
Physical Therapy Evaluation Patient Details Name: Austin Stephenson MRN: [id number] DOB: 1940-12-28 Today's Date: 07/26/2017   History of Present Illness   77 y.o. male, w h/o dm2, hypertension, hyperlipidemia, Copd, CHF (EF 40-45%)  Moderate AR, mild MR, ,  prostate cancer (metastatic), apparetnly c/o back pain.  Without radiation.  Has been getting worse for the past 2 weeks and therefore he presented to ED. Discussed with his Social worker who reports that prior scan show diffuse metastasis which includes lumbar spine, scapula, and skull.  Clinical Impression  Pt admitted with above diagnosis. Pt currently with functional limitations due to the deficits listed below (see PT Problem List). Pt needing mod to max assist to stand and only able to take 1 step with knees buckling.  Will need SNF.  Will follow acutely.  Pt will benefit from skilled PT to increase their independence and safety with mobility to allow discharge to the venue listed below.      Follow Up Recommendations SNF;Supervision/Assistance - 24 hour    Equipment Recommendations  Rolling walker with 5" wheels    Recommendations for Other Services       Precautions / Restrictions Precautions Precautions: Fall Restrictions Weight Bearing Restrictions: No      Mobility  Bed Mobility Overal bed mobility: Independent                Transfers Overall transfer level: Needs assistance Equipment used: Rolling walker (2 wheeled) Transfers: Sit to/from Stand Sit to Stand: Mod assist;Max assist;+2 physical assistance;From elevated surface         General transfer comment: Pt needed assist to power up and unsteady with knee buckling bilaterally.   Ambulation/Gait Ambulation/Gait assistance: Mod assist;Max assist Ambulation Distance (Feet): 1 Feet Assistive device: Rolling walker (2 wheeled) Gait Pattern/deviations: Step-to pattern;Decreased step length - right;Decreased step length - left;Decreased stride  length;Trunk flexed     General Gait Details: Took a step forward with knees buckling therefore had pt step back to bed.  Pt wanted to lie down due to back pain.   Stairs            Wheelchair Mobility    Modified Rankin (Stroke Patients Only)       Balance Overall balance assessment: Needs assistance Sitting-balance support: Bilateral upper extremity supported;Feet supported Sitting balance-Leahy Scale: Poor     Standing balance support: Bilateral upper extremity supported;During functional activity Standing balance-Leahy Scale: Poor Standing balance comment: relies on bil UE support                             Pertinent Vitals/Pain Pain Assessment: Faces Faces Pain Scale: Hurts whole lot Pain Location: back Pain Descriptors / Indicators: Aching;Grimacing;Guarding Pain Intervention(s): Limited activity within patient's tolerance;Monitored during session;Repositioned  Pt on 2LO2 with sats 95-96%.  92% on RA.    Home Living Family/patient expects to be discharged to:: Private residence Living Arrangements: Alone Available Help at Discharge: Available PRN/intermittently;Friend(s) Type of Home: Apartment Home Access: Stairs to enter Entrance Stairs-Rails: Right Entrance Stairs-Number of Steps: 2 Home Layout: One level [place] - single point Additional Comments: Pts apartment has bed bugs, extermination in progress.     Prior Function Level of Independence: Independent         Comments: ADLs, IADLs, and driving.      Hand Dominance   Dominant Hand: Right    Extremity/Trunk Assessment   Upper Extremity Assessment Upper Extremity Assessment: Defer to OT  evaluation    Lower Extremity Assessment Lower Extremity Assessment: Generalized weakness       Communication   Communication: No difficulties  Cognition Arousal/Alertness: Awake/alert Behavior During Therapy: WFL for tasks assessed/performed Overall Cognitive Status:  Within Functional Limits for tasks assessed                                        General Comments      Exercises     Assessment/Plan    PT Assessment Patient needs continued PT services  PT Problem List Decreased activity tolerance;Decreased balance;Decreased mobility;Decreased knowledge of use of DME;Decreased safety awareness;Decreased knowledge of precautions;Pain       PT Treatment Interventions DME instruction;Gait training;Functional mobility training;Therapeutic activities;Therapeutic exercise;Balance training;Patient/family education    PT Goals (Current goals can be found in the Care Plan section)  Acute Rehab PT Goals Patient Stated Goal: to get therapy and go home PT Goal Formulation: With patient Time For Goal Achievement: 08/09/17 Potential to Achieve Goals: Good    Frequency Min 3X/week   Barriers to discharge Decreased caregiver support      Co-evaluation               AM-PAC PT "6 Clicks" Daily Activity  Outcome Measure Difficulty turning over in bed (including adjusting bedclothes, sheets and blankets)?: None Difficulty moving from lying on back to sitting on the side of the bed? : None Difficulty sitting down on and standing up from a chair with arms (e.g., wheelchair, bedside commode, etc,.)?: A Lot Help needed moving to and from a bed to chair (including a wheelchair)?: A Lot Help needed walking in hospital room?: A Lot Help needed climbing 3-5 steps with a railing? : Total 6 Click Score: 15    End of Session Equipment Utilized During Treatment: Gait belt;Oxygen Activity Tolerance: Patient limited by fatigue Patient left: with call bell/phone within reach;in bed;with bed alarm set Nurse Communication: Mobility status;Need for lift equipment PT Visit Diagnosis: Muscle weakness (generalized) (M62.81);Pain Pain - Right/Left: (back)    Time: 3532-9924 PT Time Calculation (min) (ACUTE ONLY): 12 min   Charges:   PT  Evaluation $PT Eval Moderate Complexity: 1 Mod     PT G Codes:        [place] Austin Stephenson,PT Acute Rehabilitation [phone] (pager)   Denice Paradise 07/26/2017, 1:48 PM

## 2017-07-26 NOTE — Clinical Social Work Placement (Signed)
   CLINICAL SOCIAL WORK PLACEMENT  NOTE  Date:  07/26/2017  Patient Details  Name: [name] MRN: [id number] Date of Birth: June 13, 1940  Clinical Social Work is seeking post-discharge placement for this patient at the [place] level of care (*CSW will initial, date and re-position this form in  chart as items are completed):  Yes   Patient/family provided with [place] Work Department's list of facilities offering this level of care within the geographic area requested by the patient (or if unable, by the patient's family).  Yes   Patient/family informed of their freedom to choose among providers that offer the needed level of care, that participate in Medicare, Medicaid or managed care program needed by the patient, have an available bed and are willing to accept the patient.  Yes   Patient/family informed of [facility]'s ownership interest in [facility] and [facility], as well as of the fact that they are under no obligation to receive care at these facilities.  PASRR submitted to EDS on 07/26/17     PASRR number received on       Existing PASRR number confirmed on       FL2 transmitted to all facilities in geographic area requested by pt/family on 07/26/17     FL2 transmitted to all facilities within larger geographic area on       Patient informed that his/her managed care company has contracts with or will negotiate with certain facilities, including the following:            Patient/family informed of bed offers received.  Patient chooses bed at       Physician recommends and patient chooses bed at      Patient to be transferred to   on  .  Patient to be transferred to facility by       Patient family notified on   of transfer.  Name of family member notified:        PHYSICIAN Please sign FL2     Additional Comment:    _______________________________________________ Candie Chroman, LCSW 07/26/2017, 12:37  PM

## 2017-07-26 NOTE — Progress Notes (Signed)
   07/26/17 1100  PT Visit Information  Last PT Received On 07/26/17  PT Recommendation  Follow Up Recommendations SNF;Supervision/Assistance - 24 hour  PT equipment Rolling walker with 5" wheels  Evaluation complete.  Full note to follow.  Pt could not take steps and LEs buckling.  Will need SNF.  [place]  [place] [phone] (pager)

## 2017-07-26 NOTE — [place] FL2 (Signed)
[place] MEDICAID FL2 LEVEL OF CARE SCREENING TOOL     IDENTIFICATION  Patient Name: Austin Stephenson Birthdate: 1941-01-30 Sex: male Admission Date (Current Location): 07/22/2017  [facility] and [place] Number:  Herbalist and Address:  [facility] [place]. [facility], [place] [address], [place], [place] [address]      Provider Number: [id number]  Attending Physician Name and Address:  Cristal Ford, DO  Relative Name and Phone Number:       Current Level of Care: Hospital Recommended Level of Care: [place] Prior Approval Number:    Date Approved/Denied:   PASRR Number: Manual review  Discharge Plan: SNF    Current Diagnoses: Patient Active Problem List   Diagnosis Date Noted  . DNR (do not resuscitate)   . Palliative care by specialist   . Cancer associated pain   . Prostate cancer ([place]) 07/22/2017  . Back pain 07/22/2017  . Bipolar disorder ([place]) 02/10/2017  . Acute on chronic diastolic CHF (congestive heart failure) ([place]) 02/10/2017  . Coronary artery disease 02/10/2017  . Acute respiratory failure with hypoxia ([place]) 02/10/2017  . Esophageal ulcer   . DM (diabetes mellitus), type 2, uncontrolled, with renal complications ([place])   . CKD (chronic kidney disease) stage 3, GFR 30-59 ml/min (HCC)   . Hematemesis 12/20/2014  . Hemoptysis   . COPD mixed type ([place])   . Acute pulmonary edema (HCC)   . COPD exacerbation ([place])   . History of coronary artery bypass graft   . Essential hypertension   . Dyspnea 11/26/2014    Orientation RESPIRATION BLADDER Height & Weight     Self, Time, Situation, Place  O2(Nasal Canula 2 L) Continent Weight: 180 lb 14.4 oz (82.1 kg)(b scale) Height:  5\' 10"  (177.8 cm)  BEHAVIORAL SYMPTOMS/MOOD NEUROLOGICAL BOWEL NUTRITION STATUS  (None) (None) Incontinent(at times) Diet(Heart healthy/carb modified)  AMBULATORY STATUS COMMUNICATION OF NEEDS Skin   Extensive Assist Verbally Normal                      Personal Care Assistance Level of Assistance  Bathing, Dressing, Feeding Bathing Assistance: Limited assistance Feeding assistance: Limited assistance Dressing Assistance: Limited assistance     Functional Limitations Info  Sight, Hearing, Speech Sight Info: Adequate Hearing Info: Adequate Speech Info: Adequate    SPECIAL CARE FACTORS FREQUENCY  PT (By licensed PT), OT (By licensed OT)     PT Frequency: 5 x week OT Frequency: 5 x week            Contractures Contractures Info: Not present    Additional Factors Info  Code Status, Allergies, Psychotropic Code Status Info: DNR Allergies Info: NKDA Psychotropic Info: Bipolar Disorder: Seroquel 200 mg PO QHS.         Current Medications (07/26/2017):  This is the current hospital active medication list Current Facility-Administered Medications  Medication Dose Route Frequency Provider Last Rate Last Dose  . acetaminophen (TYLENOL) tablet 650 mg  650 mg Oral Q6H PRN Jani Gravel, MD       Or  . acetaminophen (TYLENOL) suppository 650 mg  650 mg Rectal Q6H PRN Jani Gravel, MD      . aspirin EC tablet 81 mg  81 mg Oral Daily Jani Gravel, MD   81 mg at 07/26/17 0846  . atorvastatin (LIPITOR) tablet 40 mg  40 mg Oral q1800 Jani Gravel, MD   40 mg at 07/25/17 1729  . azithromycin (ZITHROMAX) 500 mg in sodium chloride 0.9 %  250 mL IVPB  500 mg Intravenous Daily Jani Gravel, MD   Stopped at 07/26/17 1129  . furosemide (LASIX) tablet 40 mg  40 mg Oral Daily Jani Gravel, MD   40 mg at 07/26/17 0846  . gabapentin (NEURONTIN) capsule 300 mg  300 mg Oral TID Jani Gravel, MD   300 mg at 07/23/17 2303  . glipiZIDE (GLUCOTROL) tablet 2.5 mg  2.5 mg Oral BID Carmelia Roller, MD   2.5 mg at 07/26/17 0615  . heparin injection 5,000 Units  5,000 Units Subcutaneous Q8H Lavenia Atlas, RPH   5,000 Units at 07/26/17 6333  . insulin aspart (novoLOG) injection 0-20 Units  0-20 Units Subcutaneous TID WC Velvet Bathe, MD   3 Units at 07/26/17 1159   . insulin aspart (novoLOG) injection 0-5 Units  0-5 Units Subcutaneous QHS Jani Gravel, MD   4 Units at 07/23/17 2303  . insulin glargine (LANTUS) injection 40 Units  40 Units Subcutaneous QHS Jani Gravel, MD   40 Units at 07/25/17 2203  . ipratropium-albuterol (DUONEB) 0.5-2.5 (3) MG/3ML nebulizer solution 3 mL  3 mL Nebulization Q6H PRN Jani Gravel, MD      . ipratropium-albuterol (DUONEB) 0.5-2.5 (3) MG/3ML nebulizer solution 3 mL  3 mL Nebulization TID Velvet Bathe, MD   3 mL at 07/26/17 0738  . losartan (COZAAR) tablet 25 mg  25 mg Oral Daily Jani Gravel, MD   25 mg at 07/26/17 0847  . metoprolol succinate (TOPROL-XL) 24 hr tablet 50 mg  50 mg Oral Daily Jani Gravel, MD   50 mg at 07/26/17 0847  . mometasone-formoterol (DULERA) 200-5 MCG/ACT inhaler 2 puff  2 puff Inhalation BID Jani Gravel, MD   2 puff at 07/26/17 0740  . morphine 2 MG/ML injection 1 mg  1 mg Intravenous Q4H PRN Jani Gravel, MD   1 mg at 07/23/17 0308  . oxyCODONE (Oxy IR/ROXICODONE) immediate release tablet 5-10 mg  5-10 mg Oral Q6H PRN Ladell Pier, MD      . predniSONE (DELTASONE) tablet 60 mg  60 mg Oral Q breakfast Jani Gravel, MD   60 mg at 07/26/17 5456  . QUEtiapine (SEROQUEL) tablet 200 mg  200 mg Oral QHS Jani Gravel, MD   200 mg at 07/25/17 2205  . tamsulosin (FLOMAX) capsule 0.8 mg  0.8 mg Oral Daily Jani Gravel, MD   0.8 mg at 07/26/17 2563     Discharge Medications: Please see discharge summary for a list of discharge medications.  Relevant Imaging Results:  Relevant Lab Results:   Additional Information SS#: [id number]. Patient reports bed bugs at home but none have been found here at the hospital. He understands he cannot bring his belongings into the facility.  Candie Chroman, LCSW

## 2017-07-26 NOTE — Clinical Social Work Placement (Signed)
   CLINICAL SOCIAL WORK PLACEMENT  NOTE  Date:  07/26/2017  Patient Details  Name: Austin Stephenson MRN: [id number] Date of Birth: 1940/05/25  Clinical Social Work is seeking post-discharge placement for this patient at the [place] level of care (*CSW will initial, date and re-position this form in  chart as items are completed):  Yes   Patient/family provided with [place] Work Department's list of facilities offering this level of care within the geographic area requested by the patient (or if unable, by the patient's family).  Yes   Patient/family informed of their freedom to choose among providers that offer the needed level of care, that participate in Medicare, Medicaid or managed care program needed by the patient, have an available bed and are willing to accept the patient.  Yes   Patient/family informed of [facility]'s ownership interest in [facility] and [facility], as well as of the fact that they are under no obligation to receive care at these facilities.  PASRR submitted to EDS on 07/26/17     PASRR number received on 07/26/17     Existing PASRR number confirmed on       FL2 transmitted to all facilities in geographic area requested by pt/family on 07/26/17     FL2 transmitted to all facilities within larger geographic area on       Patient informed that his/her managed care company has contracts with or will negotiate with certain facilities, including the following:        Yes   Patient/family informed of bed offers received.  Patient chooses bed at [facility]     Physician recommends and patient chooses bed at      Patient to be [facility] on 07/26/17.  Patient to be transferred to facility by [facility]     Patient family notified on 07/26/17 of transfer.  Name of family member notified:  Patient declined     PHYSICIAN       Additional Comment:     _______________________________________________ Candie Chroman, LCSW 07/26/2017, 4:30 PM

## 2017-07-26 NOTE — Discharge Summary (Addendum)
Physician Discharge Summary  Austin Stephenson ELF:[id number] DOB: 1941/05/15 DOA: 07/22/2017  PCP: [facility]  Admit date: 07/22/2017 Discharge date: 07/26/2017  Time spent: 45 minutes  Recommendations for Outpatient Follow-up:  Patient will be discharged to skilled nursing facility.  Patient will need to follow up with primary care provider within one week of discharge.  Patient should continue medications as prescribed.  Patient should follow a heart healthy/carb modified diet.   Discharge Diagnoses:  Back pain/Cancer associated pain Diabetes mellitus, type II Chronic diastolic heart failure COPD Bipolar disorder Goals of care  Discharge Condition: Stable  Diet recommendation: Heart healthy/carb modified  Filed Weights   07/24/17 0432 07/25/17 0416 07/26/17 0516  Weight: 83.2 kg (183 lb 6.4 oz) 84 kg (185 lb 1.6 oz) 82.1 kg (180 lb 14.4 oz)    History of present illness:  On 07/22/2017 by Dr. Jani Gravel Austin Stephenson  is a 77 y.o. male, w h/o dm2, hypertension, hyperlipidemia, Copd, CHF (EF 40-45%)  Moderate AR, mild MR, ,  prostate cancer (metastatic), apparetnly c/o back pain.  Without radiation.  Has been getting worse for the past 2 weeks and therefore he presented to ED,  Dyspnea was a secondary complain.  Pt denies fever, chills, cp, palp, n/v, diarrhea, brbpr.   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  Patient may follow-up with Dr. Benay Spice, at the cancer center, if he decides to remain in [place] pain control -PT consulted  recommending SNF with rolling walker -Discussed pain regimen with palliative care, recommended [place] ER 15 mg twice daily scheduled along with OxyIR 5 mg every 4 to 6 hours as needed.  Diabetes mellitus, type II -continue home medications, glipizide, insulin sliding scale, lantus  Chronic diastolic heart failure -Patient currently appears to be euvolemic and compensated -continue lasix  COPD -No active wheezing on exam -continue neb treatments as needed, symbicort -was thought to be in exacerbation, was placed azithromycin  -will discharge with 1 additional day of azithromycin as well as prednisone taper  Bipolar disorder -Continue seroquel  Goals of care -As above, patient has a history of metastatic prostate cancer -Palliative care consulted and appreciated -Patient currently DNR -Eventually plan is for patient to move to [place], [place] with his brother  Procedures: None  Consultations: None  Discharge Exam: Vitals:   07/26/17 1200 07/26/17 1346  BP: (!) 100/53   Pulse: 89   Resp: 18   Temp: 97.7 F (36.5 C)   SpO2: 96% 93%   Patient seen and examined on day of discharge. Denies chest pain, current shortness of breath, abdominal pain, nausea, vomiting, diarrhea, constipation. Feels back pain is well controlled.   General: Well developed, well nourished, NAD, appears stated age  44: NCAT,mucous membranes moist.  Neck: Supple  Cardiovascular: S1 S2 auscultated, no rubs, murmurs or gallops. Regular rate and rhythm.  Respiratory: diminished breath sounds, fine crackles  Abdomen: Soft, nontender, nondistended, + bowel sounds  Extremities: warm dry without cyanosis clubbing or edema  Neuro: AAOx3, nonfocal  Psych: anxious, however appropriate  Discharge Instructions Discharge Instructions    Discharge instructions   Complete by:  As directed    Patient  will be discharged to skilled nursing facility.  Patient will need to follow up with  primary care provider within one week of discharge.  Patient should continue medications as prescribed.  Patient should follow a heart healthy/carb modified diet.     Allergies as of 07/26/2017   No Known Allergies     Medication List    STOP taking these medications   fentaNYL 25 MCG/HR patch Commonly known as:  DURAGESIC - dosed mcg/hr   finasteride 5 MG tablet Commonly known as:  PROSCAR   HYDROcodone-acetaminophen 10-325 MG tablet Commonly known as:  NORCO   sildenafil 100 MG tablet Commonly known as:  VIAGRA     TAKE these medications   aspirin 81 MG EC tablet Take 1 tablet (81 mg total) by mouth daily after breakfast. What changed:  when to take this   aspirin-acetaminophen-caffeine 250-250-65 MG tablet Commonly known as:  EXCEDRIN MIGRAINE Take 1 tablet by mouth every 6 (six) hours as needed for headache.   azithromycin 500 MG tablet Commonly known as:  ZITHROMAX Take 1 tablet (500 mg total) by mouth daily. Start taking on:  07/27/2017   bisacodyl 5 MG EC tablet Commonly known as:  DULCOLAX Take 10 mg by mouth daily.   budesonide-formoterol 160-4.5 MCG/ACT inhaler Commonly known as:  SYMBICORT Inhale 2 puffs into the lungs 2 (two) times daily as needed (wheezing).   furosemide 40 MG tablet Commonly known as:  LASIX Take 40 mg by mouth daily.   glucose 4 GM chewable tablet Chew 1 tablet by mouth See admin instructions. Chew 1-4 tablet (4 -16 gm) by mouth<70 until blood sugar is increased and dizziness stops   insulin aspart 100 UNIT/ML injection Commonly known as:  novoLOG Inject 15-20 Units into the skin See admin instructions. 15 units subcutaneously with breakfast, 20 units with lunch, 20 units with dinner   insulin glargine 100 UNIT/ML injection Commonly known as:  LANTUS Inject 0.2 mLs (20 Units total) into the skin at bedtime. What changed:  how much to take   ipratropium-albuterol 0.5-2.5 (3) MG/3ML Soln Commonly known as:  DUONEB Take 3 mLs by  nebulization every 6 (six) hours as needed. What changed:  reasons to take this   oxyCODONE 5 MG immediate release tablet Commonly known as:  Oxy IR/ROXICODONE Take 1-2 tablets (5-10 mg total) by mouth every 6 (six) hours as needed for severe pain.   oxyCODONE 15 mg 12 hr tablet Commonly known as:  OXYCONTIN Take 1 tablet (15 mg total) by mouth every 12 (twelve) hours.   pantoprazole 40 MG tablet Commonly known as:  PROTONIX Take 40 mg by mouth daily.   polyethylene glycol packet Commonly known as:  MIRALAX / GLYCOLAX Take 17 g by mouth 2 (two) times daily.   predniSONE 20 MG tablet Commonly known as:  DELTASONE Take 3 tabs x 1 day, then take 2 tabs x 2 days, then 1 tab x 2 days.   QUEtiapine 100 MG tablet Commonly known as:  SEROQUEL Take 2 tablets (200 mg total) by mouth at bedtime. What changed:    how much to take  when to take this  reasons to take this   senna 8.6 MG Tabs tablet Commonly known as:  SENOKOT Take 2 tablets by mouth daily.   tamsulosin 0.4 MG Caps capsule Commonly known as:  FLOMAX Take 0.8 mg by mouth daily.      No Known Allergies  Contact information for follow-up providers    [facility], [facility].  Schedule an appointment as soon as possible for a visit in 1 week(s).   Why:  Hospital follow-up Contact information: [place] [address] [phone]        Ladell Pier, MD. Schedule an appointment as soon as possible for a visit.   Specialty:  Oncology Why:  As needed Contact information: [place] [address] [phone]            Contact information for after-discharge care    [facility] Follow up.   Service:  Skilled Nursing Contact information: [address] [place] [phone]                   The results of significant diagnostics from this hospitalization (including imaging,  microbiology, ancillary and laboratory) are listed below for reference.    Significant Diagnostic Studies: Dg Chest 2 View  Result Date: 07/22/2017 CLINICAL DATA:  Cough and shortness of breath in a patient with a history of COPD. EXAM: CHEST  2 VIEW COMPARISON:  PA and lateral chest 02/10/2017 and 12/22/2014. FINDINGS: The lungs are emphysematous. No consolidative process, pneumothorax or effusion. There is cardiomegaly. The patient is status post CABG. Aortic atherosclerosis noted. IMPRESSION: No acute disease. Cardiomegaly. COPD. Atherosclerosis. Electronically Signed   By: Inge Rise M.D.   On: 07/22/2017 21:13   Ct Abdomen Pelvis W Contrast  Result Date: 07/22/2017 CLINICAL DATA:  Initial evaluation for metastatic prostate cancer, difficulty with pain management. Back pain. EXAM: CT ABDOMEN AND PELVIS WITH CONTRAST TECHNIQUE: Multidetector CT imaging of the abdomen and pelvis was performed using the standard protocol following bolus administration of intravenous contrast. CONTRAST:  162mL ISOVUE-300 IOPAMIDOL (ISOVUE-300) INJECTION 61% COMPARISON:  None. FINDINGS: CT ABDOMEN AND PELVIS FINDINGS Lower chest: Scattered linear atelectasis present within the visualized lung bases. Visualized lung bases are otherwise clear. Hepatobiliary: Liver demonstrates a normal contrast enhanced appearance. Gallbladder within normal limits. For NG and capped noted. Minimal intrahepatic biliary dilatation noted, of uncertain clinical significance. Pancreas: Pancreas within normal limits. Spleen: Subcentimeter hypodensity noted within the medial spleen, of doubtful significance. Spleen otherwise unremarkable. Adrenals/Urinary Tract: Adrenal glands are normal. Kidneys equal in size with symmetric enhancement. The subcentimeter hypodensity at the lower pole of the left kidney noted, too small the characterize, but statistically likely reflects a small cyst. No nephrolithiasis, hydronephrosis, or focal enhancing renal  mass. No hydroureter. Moderate distension of the urinary bladder noted. Bladder otherwise unremarkable. Stomach/Bowel: Stomach within normal limits. No evidence for bowel obstruction. Appendix within normal limits. No acute inflammatory changes seen about the bowels. Vascular/Lymphatic: Moderate aorto bi-iliac atherosclerotic disease. No aneurysm. Mesenteric vessels patent proximally. No adenopathy. Reproductive: Prostate within normal limits. No appreciable prostatic mass. Other: No free air or fluid. Musculoskeletal: Diffuse osseous metastases seen throughout the pelvis and spine. No appreciable pathologic fracture. CT LUMBAR SPINE FINDINGS: Segmentation: Is normal segmentation. Lowest well-formed disc labeled the L5-S1 level. Alignment: Vertebral bodies normally aligned with preservation of the normal lumbar lordosis. No listhesis. Vertebrae: Diffuse abnormal sclerotic lesion seen throughout the visualized osseous structures, consistent with widespread osseous metastases. No pathologic fracture. No significant extra osseous extension of tumor. Paraspinal and other soft tissues: Paraspinous soft tissues demonstrate no acute abnormality. Disc levels: Diffuse congenital shortening of the pedicles noted. L1-2: Diffuse disc bulge with disc desiccation. Mild bilateral facet arthrosis. Mild spinal stenosis, largely due to short pedicles. Foramina remain patent. L2-3: Mild diffuse disc bulge. Bilateral facet  hypertrophy. Resultant mild spinal stenosis. Mild bilateral L2 foraminal narrowing. L3-4: Diffuse disc bulge. Moderate bilateral facet hypertrophy. Changes superimposed on short pedicles result in moderate to advance spinal stenosis. Moderate to advanced bilateral L3 foraminal stenosis, right worse than left. L4-5: Diffuse disc bulge. Mild facet hypertrophy. Ligamentum flavum calcification. Changes superimposed on short pedicles results in moderate to severe spinal stenosis. Severe bilateral L4 foraminal narrowing.  L5-S1: Diffuse disc bulge with intervertebral disc space narrowing. Reactive endplate changes. Mild bilateral facet hypertrophy. No significant spinal stenosis. Moderate right with advanced left L5 foraminal narrowing. IMPRESSION: CT ABDOMEN AND PELVIS IMPRESSION: 1. No acute abnormality identified within the abdomen and pelvis. 2. 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 No pathologic fracture. 3. Moderate aorto bi-iliac atherosclerotic disease.  No aneurysm. CT LUMBAR SPINE IMPRESSION: 1. 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 No associated pathologic fracture. No significant extra osseous extension of tumor. 2. 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 3. Multifactorial foraminal narrowing as above, moderate to advanced at L3-4 through L5-S1. Electronically Signed   By: Jeannine Boga M.D.   On: 07/22/2017 22:22   Ct L-spine No Charge  Result Date: 07/22/2017 CLINICAL DATA:  Initial evaluation for metastatic prostate cancer, difficulty with pain management. Back pain. EXAM: CT ABDOMEN AND PELVIS WITH CONTRAST TECHNIQUE: Multidetector CT imaging of the abdomen and pelvis was performed using the standard protocol following bolus administration of intravenous contrast. CONTRAST:  12mL ISOVUE-300 IOPAMIDOL (ISOVUE-300) INJECTION 61% COMPARISON:  None. FINDINGS: CT ABDOMEN AND PELVIS FINDINGS Lower chest: Scattered linear atelectasis present within the visualized lung bases. Visualized lung bases are otherwise clear. Hepatobiliary: Liver demonstrates a normal contrast enhanced appearance. Gallbladder within normal limits. For NG and capped noted. Minimal intrahepatic biliary dilatation noted, of uncertain clinical significance. Pancreas: Pancreas within normal limits. Spleen: Subcentimeter hypodensity noted within the medial spleen, of doubtful significance. Spleen otherwise unremarkable. Adrenals/Urinary Tract: Adrenal  glands are normal. Kidneys equal in size with symmetric enhancement. The subcentimeter hypodensity at the lower pole of the left kidney noted, too small the characterize, but statistically likely reflects a small cyst. No nephrolithiasis, hydronephrosis, or focal enhancing renal mass. No hydroureter. Moderate distension of the urinary bladder noted. Bladder otherwise unremarkable. Stomach/Bowel: Stomach within normal limits. No evidence for bowel obstruction. Appendix within normal limits. No acute inflammatory changes seen about the bowels. Vascular/Lymphatic: Moderate aorto bi-iliac atherosclerotic disease. No aneurysm. Mesenteric vessels patent proximally. No adenopathy. Reproductive: Prostate within normal limits. No appreciable prostatic mass. Other: No free air or fluid. Musculoskeletal: Diffuse osseous metastases seen throughout the pelvis and spine. No appreciable pathologic fracture. CT LUMBAR SPINE FINDINGS: Segmentation: Is normal segmentation. Lowest well-formed disc labeled the L5-S1 level. Alignment: Vertebral bodies normally aligned with preservation of the normal lumbar lordosis. No listhesis. Vertebrae: Diffuse abnormal sclerotic lesion seen throughout the visualized osseous structures, consistent with widespread osseous metastases. No pathologic fracture. No significant extra osseous extension of tumor. Paraspinal and other soft tissues: Paraspinous soft tissues demonstrate no acute abnormality. Disc levels: Diffuse congenital shortening of the pedicles noted. L1-2: Diffuse disc bulge with disc desiccation. Mild bilateral facet arthrosis. Mild spinal stenosis, largely due to short pedicles. Foramina remain patent. L2-3: Mild diffuse disc bulge. Bilateral facet hypertrophy. Resultant mild spinal stenosis. Mild bilateral L2 foraminal narrowing. L3-4: Diffuse disc bulge. Moderate bilateral facet hypertrophy. Changes superimposed on short pedicles result in moderate to advance spinal stenosis. Moderate  to advanced bilateral L3 foraminal stenosis, right worse than  left. L4-5: Diffuse disc bulge. Mild facet hypertrophy. Ligamentum flavum calcification. Changes superimposed on short pedicles results in moderate to severe spinal stenosis. Severe bilateral L4 foraminal narrowing. L5-S1: Diffuse disc bulge with intervertebral disc space narrowing. Reactive endplate changes. Mild bilateral facet hypertrophy. No significant spinal stenosis. Moderate right with advanced left L5 foraminal narrowing. IMPRESSION: CT ABDOMEN AND PELVIS IMPRESSION: 1. No acute abnormality identified within the abdomen and pelvis. 2. 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 No pathologic fracture. 3. Moderate aorto bi-iliac atherosclerotic disease.  No aneurysm. CT LUMBAR SPINE IMPRESSION: 1. 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 No associated pathologic fracture. No significant extra osseous extension of tumor. 2. 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 3. Multifactorial foraminal narrowing as above, moderate to advanced at L3-4 through L5-S1. Electronically Signed   By: Jeannine Boga M.D.   On: 07/22/2017 22:22    Microbiology: Recent Results (from the past 240 hour(s))  MRSA PCR Screening     Status: None   Collection Time: 07/23/17  2:58 AM  Result Value Ref Range Status   MRSA by PCR NEGATIVE NEGATIVE Final    Comment:        The GeneXpert MRSA Assay (FDA approved for NASAL specimens only), is one component of a comprehensive MRSA colonization surveillance program. It is not intended to diagnose MRSA infection nor to guide or monitor treatment for MRSA infections. Performed at [facility], [place] [address] [place], [place] [address]      Labs: Basic Metabolic Panel: Recent Labs  Lab 07/22/17 2014 07/23/17 0619  NA 136 137  K 3.8 4.0  CL 98* 100*  CO2 27 25  GLUCOSE 166* 384*  BUN 21* 23*  CREATININE 1.28*  1.37*  CALCIUM 8.7* 8.5*   Liver Function Tests: Recent Labs  Lab 07/22/17 2014 07/23/17 0619  AST 29 25  ALT 15* 15*  ALKPHOS 1,047* 1,029*  BILITOT 1.1 0.9  PROT 6.5 6.5  ALBUMIN 3.6 3.3*   Recent Labs  Lab 07/22/17 2014  LIPASE 22   No results for input(s): AMMONIA in the last 168 hours. CBC: Recent Labs  Lab 07/22/17 2014  WBC 6.2  NEUTROABS 4.5  HGB 14.6  HCT 40.7  MCV 92.1  PLT 163   Cardiac Enzymes: No results for input(s): CKTOTAL, CKMB, CKMBINDEX, TROPONINI in the last 168 hours. BNP: BNP (last 3 results) Recent Labs    02/10/17 1646  BNP 377.6*    ProBNP (last 3 results) No results for input(s): PROBNP in the last 8760 hours.  CBG: Recent Labs  Lab 07/25/17 1207 07/25/17 1650 07/25/17 2110 07/26/17 0750 07/26/17 1120  GLUCAP 109* 172* 137* 114* 124*       Signed:  Karder Goodin  [facility] 07/26/2017, 3:46 PM

## 2017-07-26 NOTE — Clinical Social Work Note (Addendum)
Requested documents uploaded into [place] [employer] for PASARR review.  Dayton Scrape, CSW [phone]  3:33 pm PASARR obtained: [id number]. Patient has two bed offers: [facility] and [facility]. [facility] is the only one with a bed today. Patient is agreeable to this facility. CSW paged MD to notify.  Dayton Scrape, [place]

## 2017-07-26 NOTE — Care Management Note (Signed)
Case Management Note  Patient Details  Name: Austin Stephenson MRN: [id number] Date of Birth: 11-Nov-1940  Subjective/Objective:  COPD                 Action/Plan: Patient lives alone; goes to the [facility] for medical care; patient gave CM permission to talk to his friend Austin Stephenson SW / friend; patient's apt has bed bugs. Pt states that he does not have transportation and cant stay in his apt until the bed bugs have been resolved. CM talked to Austin Stephenson- she stated that his apt managers are taking care of the bed bugs, not sure if he can stay in the apt tonight. She left a message with the apt Manager to make her aware of the discharge home today; [place] with the [facility] in [place] called - awaiting for call back for additional assistance; patient stated that he has difficulty walking; PT eval ordered/ awaiting for eval.  12:02- CM talked to Physical Therapist, she stated that the patient was unable to take a few steps; weak and deconditioned. Patient is for SNF placement; April with the [facility] called; his SW with the [facility] is Austin Stephenson [phone] ext [phone] and back up SW is Austin Stephenson ext [phone]; Austin Stephenson made aware of disposition.  12:24 pm Received call from Austin Stephenson with the [facility] [phone]) Per Austin Stephenson, patient is very well connected with the [facility]; they are providing medical treatment and they are providing transportation but due to bed bugs they cannot transport him until this problem is taken care of. He is not locked out of his home, he has a key; Austin Stephenson told the patient that if he did not want to go back to his apt he can go to a [employer] or a Shelter. She stated that patient has a lot of anxiety and diagnosed with Schizophrenia; His cancer treatment started last month; his pain medication has been changed 3 times- initially it was oxycodone, it was later changed to Fentanyl patches but he was using 2 patches at one time then it was changed to Morphine. He also had a  colonoscopy last month also.  Austin Stephenson stated that his friend Austin Stephenson was his Case Freight forwarder at [employer] and now she is his advocate. [facility] in [facility] that they can give her because the patient must submit in writing that the [facility] can give her information and he has not done this.  Expected Discharge Date:    possibly 07/27/2017              Expected Discharge Plan:   SNF  Status of Service:   In progress  Sherrilyn Rist [phone] 07/26/2017, 10:42 AM

## 2017-07-28 ENCOUNTER — Telehealth: Payer: Self-pay | Admitting: *Deleted

## 2017-07-28 NOTE — Telephone Encounter (Signed)
Unable to reach pt's nurse at [facility] despite several attmepts. Transferred several times and then disconnected. Called pt's cell phone, he reports he is moving to [place], [place] on 07/30/17. His brother is coming to pick him up. Instructed pt to have his brother call the office if he has any questions for the oncologist.  Pt reports he has notified the [facility] of his plan to move.

## 2017-08-10 ENCOUNTER — Encounter: Payer: Self-pay | Admitting: Radiation Oncology

## 2017-08-10 NOTE — Progress Notes (Signed)
Rec'd a vcml from [place] at [facility], [phone]) to ask if we had rec'd a referral from them on pt. I left her a vcml saying that we had not rec'd a referral or auth from them. I gave them the fax # and our phone # to get them started.

## 2019-06-24 DEATH — deceased

## 2019-10-24 IMAGING — CT CT ABD-PELV W/ CM
2 of 8 series · 11 of 46 positions shown, 16 images · IV contrast (iopamidol)
Comparison: None.

CLINICAL DATA: Initial evaluation for metastatic prostate cancer,
difficulty with pain management. Back pain.

EXAM:
CT ABDOMEN AND PELVIS WITH CONTRAST
TECHNIQUE: Multidetector CT imaging of the abdomen and pelvis was performed
using the standard protocol following bolus administration of
intravenous contrast.
CONTRAST:  100mL [id number] IOPAMIDOL ([id number]) INJECTION 61%

[Series 3: abd/ pelvis 5.0 i30f 2 · axial · 0.86mm/px · z∈[+813,+1158]mm · 8 of 89 slices shown, 13 images]
[im 10/89  soft-tissue]
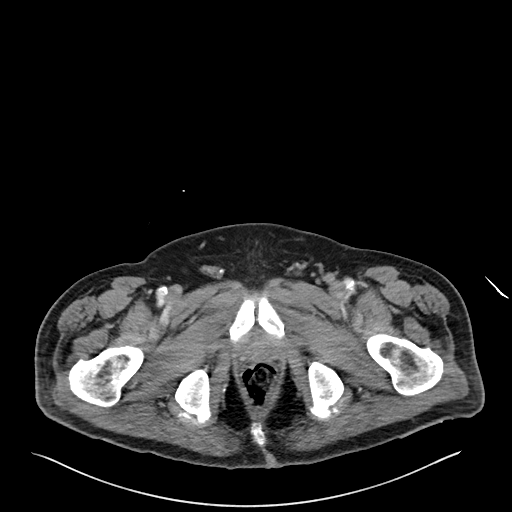
[im 10/89  bone]
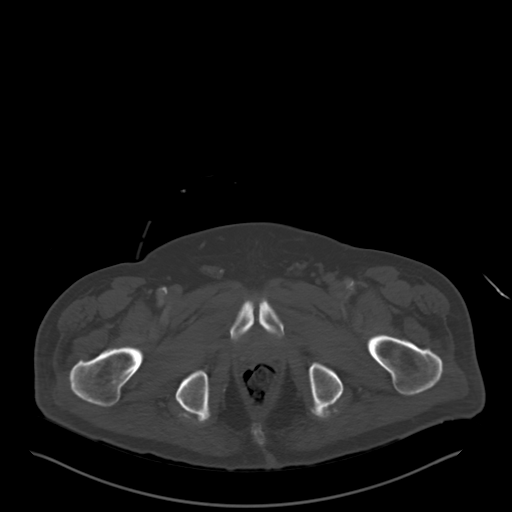
[im 20/89  soft-tissue]
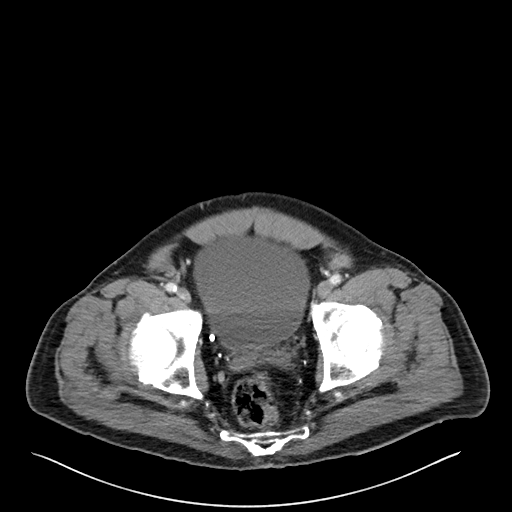
[im 30/89  soft-tissue]
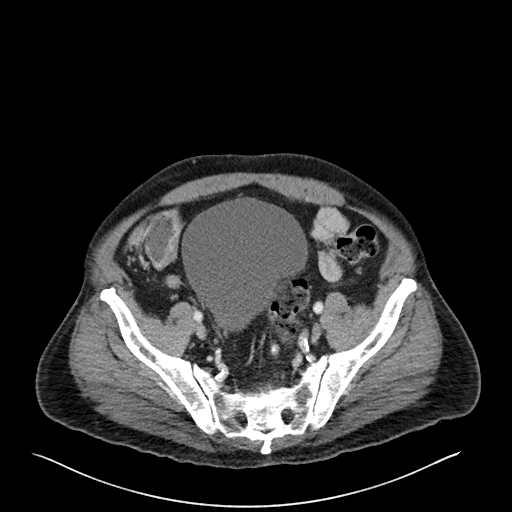
[im 40/89  soft-tissue]
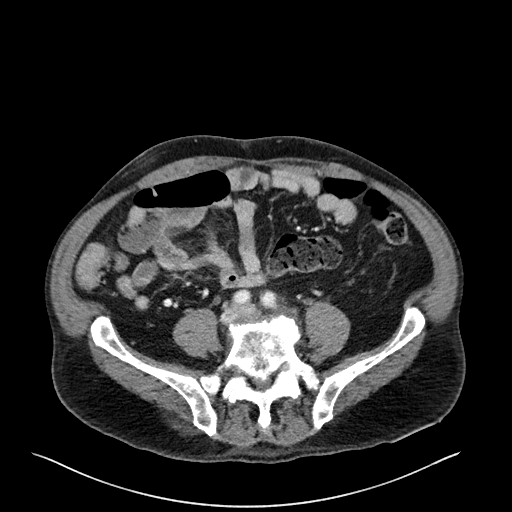
[im 49/89  soft-tissue]
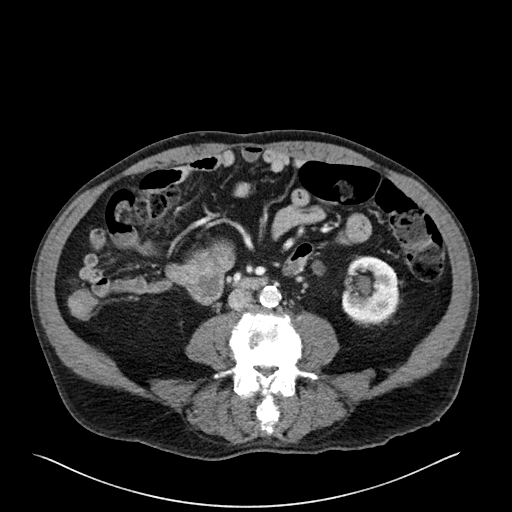
[im 49/89  lung]
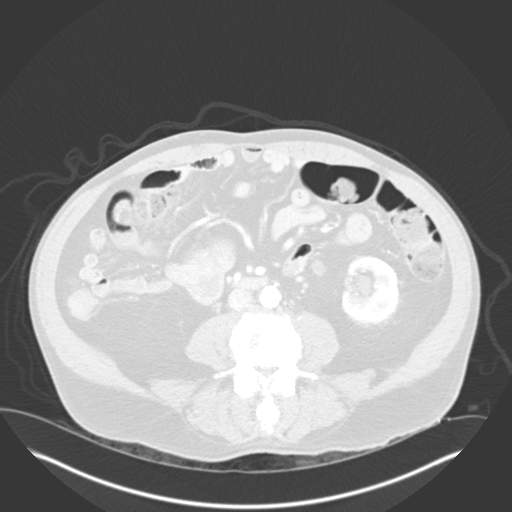
[im 59/89  soft-tissue]
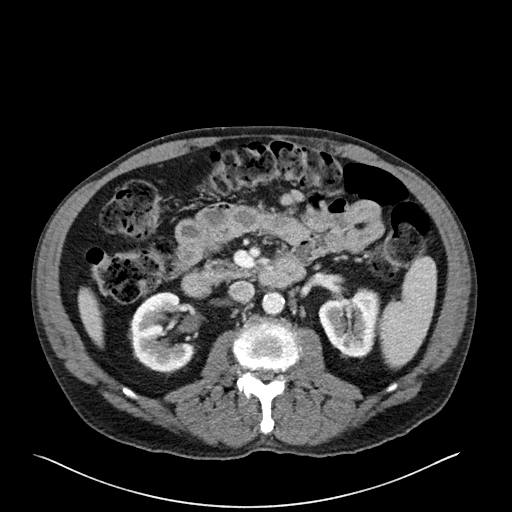
[im 59/89  lung]
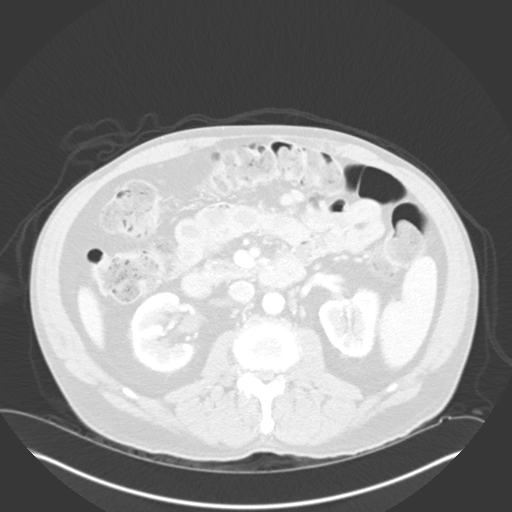
[im 69/89  soft-tissue]
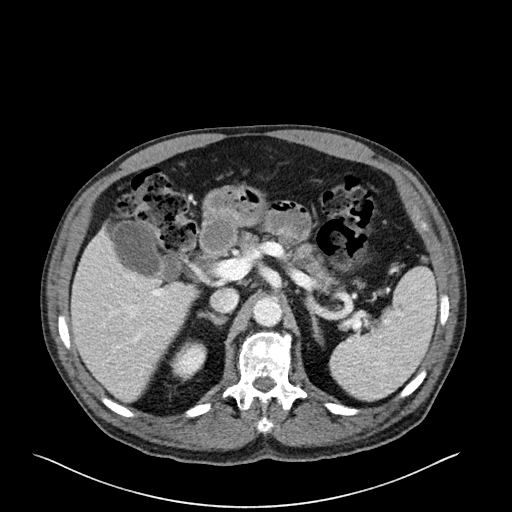
[im 69/89  lung]
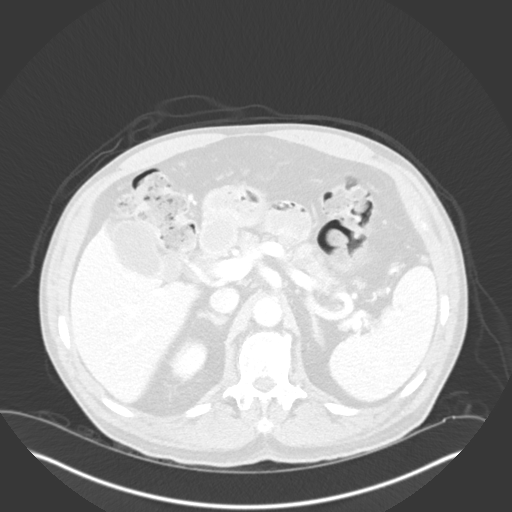
[im 79/89  soft-tissue]
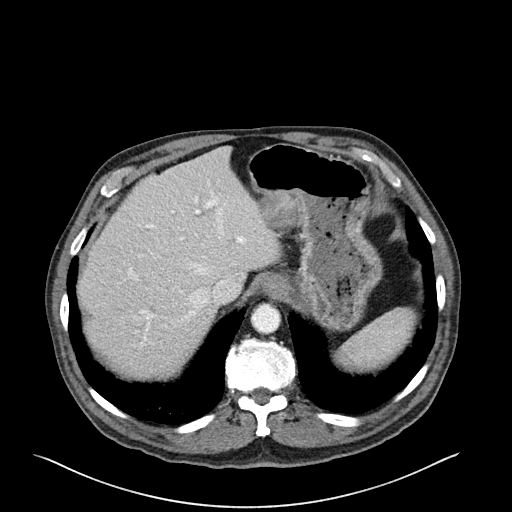
[im 79/89  lung]
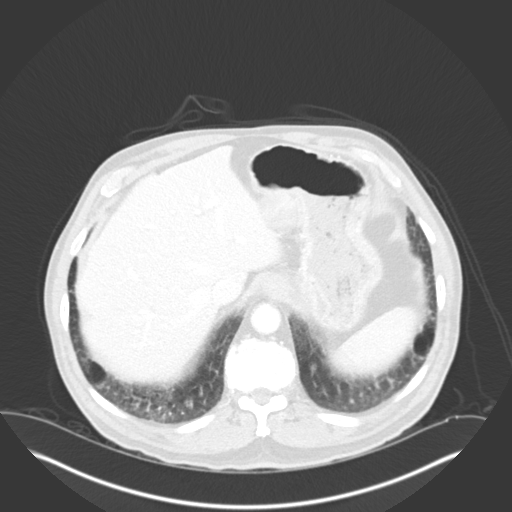

[Series 7: coronal soft tissue · coronal · 0.87mm/px · 3 of 101 slices shown]
[im 26/101  soft-tissue]
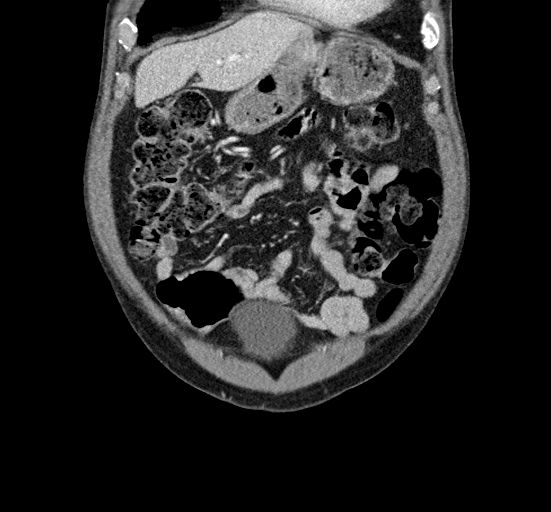
[im 51/101  soft-tissue]
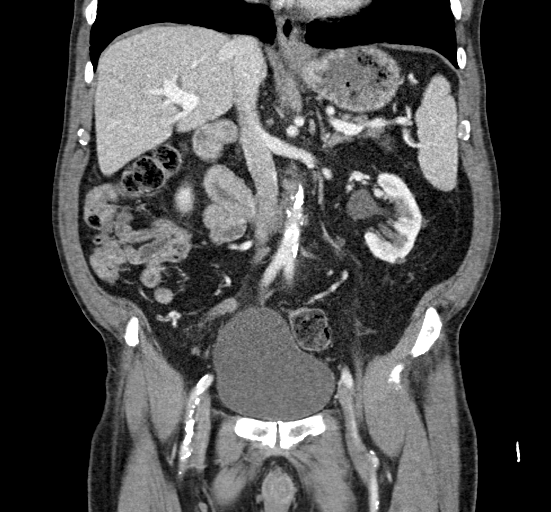
[im 76/101  soft-tissue]
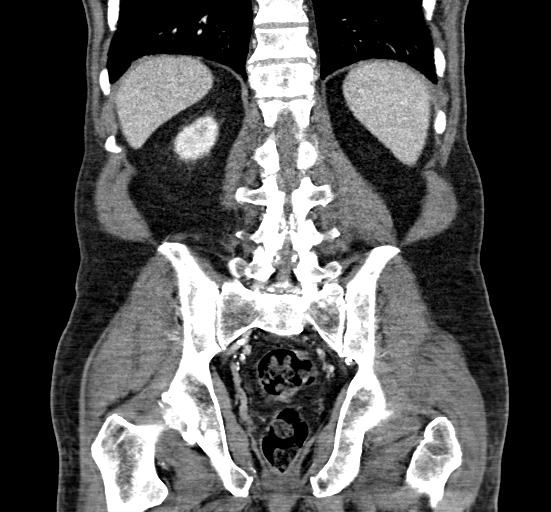

[11 of 46 positions shown; findings below may reference images not displayed]

FINDINGS: CT ABDOMEN AND PELVIS FINDINGS

Lower chest: Scattered linear atelectasis present within the
visualized lung bases. Visualized lung bases are otherwise clear.

Hepatobiliary: Liver demonstrates a normal contrast enhanced
appearance. Gallbladder within normal limits. For NG and capped
noted. Minimal intrahepatic biliary dilatation noted, of uncertain
clinical significance.

Pancreas: Pancreas within normal limits.

Spleen: Subcentimeter hypodensity noted within the medial spleen, of
doubtful significance. Spleen otherwise unremarkable.

Adrenals/Urinary Tract: Adrenal glands are normal. Kidneys equal in
size with symmetric enhancement. The subcentimeter hypodensity at
the lower pole of the left kidney noted, too small the characterize,
but statistically likely reflects a small cyst. No nephrolithiasis,
hydronephrosis, or focal enhancing renal mass. No hydroureter.
Moderate distension of the urinary bladder noted. Bladder otherwise
unremarkable.

Stomach/Bowel: Stomach within normal limits. No evidence for bowel
obstruction. Appendix within normal limits. No acute inflammatory
changes seen about the bowels.

Vascular/Lymphatic: Moderate aorto bi-iliac atherosclerotic disease.
No aneurysm. Mesenteric vessels patent proximally. No adenopathy.

Reproductive: Prostate within normal limits. No appreciable
prostatic mass.

Other: No free air or fluid.

Musculoskeletal: Diffuse osseous metastases seen throughout the
pelvis and spine. No appreciable pathologic fracture.

CT LUMBAR SPINE FINDINGS:

Segmentation: Is normal segmentation. Lowest well-formed disc
labeled the L5-S1 level.

Alignment: Vertebral bodies normally aligned with preservation of
the normal lumbar lordosis. No listhesis.

Vertebrae: Diffuse abnormal sclerotic lesion seen throughout the
visualized osseous structures, consistent with widespread osseous
metastases. No pathologic fracture. No significant extra osseous
extension of tumor.

Paraspinal and other soft tissues: Paraspinous soft tissues
demonstrate no acute abnormality.

Disc levels:

Diffuse congenital shortening of the pedicles noted.

L1-2: Diffuse disc bulge with disc desiccation. Mild bilateral facet
arthrosis. Mild spinal stenosis, largely due to short pedicles.
Foramina remain patent.

L2-3: Mild diffuse disc bulge. Bilateral facet hypertrophy.
Resultant mild spinal stenosis. Mild bilateral L2 foraminal
narrowing.

L3-4: Diffuse disc bulge. Moderate bilateral facet hypertrophy.
Changes superimposed on short pedicles result in moderate to advance
spinal stenosis. Moderate to advanced bilateral L3 foraminal
stenosis, right worse than left.

L4-5: Diffuse disc bulge. Mild facet hypertrophy. Ligamentum flavum
calcification. Changes superimposed on short pedicles results in
moderate to severe spinal stenosis. Severe bilateral L4 foraminal
narrowing.

L5-S1: Diffuse disc bulge with intervertebral disc space narrowing.
Reactive endplate changes. Mild bilateral facet hypertrophy. No
significant spinal stenosis. Moderate right with advanced left L5
foraminal narrowing.
IMPRESSION: CT ABDOMEN AND PELVIS IMPRESSION:

1. No acute abnormality identified within the abdomen and pelvis.
2. Diffuse osseous metastases involving the lumbar spine and sacrum.
No pathologic fracture.
3. Moderate aorto bi-iliac atherosclerotic disease.  No aneurysm.

CT LUMBAR SPINE IMPRESSION:

1. Diffuse osseous metastases involving the lumbar spine. No
associated pathologic fracture. No significant extra osseous
extension of tumor.
2. Acquired on congenital spinal stenosis, moderate to severe at the
L3-4 and L4-5 levels.
3. Multifactorial foraminal narrowing as above, moderate to advanced
at L3-4 through L5-S1.
